# Patient Record
Sex: Female | Born: 1955 | Race: White | Hispanic: No | Smoking: Never smoker
Health system: Southern US, Community
[De-identification: ages and names within clinical notes are randomized; demographics above are authoritative.]

## PROBLEM LIST (undated history)

## (undated) DIAGNOSIS — R001 Bradycardia, unspecified: Secondary | ICD-10-CM

## (undated) DIAGNOSIS — B019 Varicella without complication: Secondary | ICD-10-CM

## (undated) DIAGNOSIS — T7840XA Allergy, unspecified, initial encounter: Secondary | ICD-10-CM

## (undated) HISTORY — DX: Varicella without complication: B01.9

## (undated) HISTORY — DX: Allergy, unspecified, initial encounter: T78.40XA

## (undated) HISTORY — DX: Bradycardia, unspecified: R00.1

---

## 1999-06-11 HISTORY — PX: ABDOMINAL HYSTERECTOMY: SHX81

## 2005-02-27 ENCOUNTER — Ambulatory Visit: Payer: Self-pay | Admitting: Unknown Physician Specialty

## 2006-03-04 ENCOUNTER — Ambulatory Visit: Payer: Self-pay | Admitting: Unknown Physician Specialty

## 2006-06-10 HISTORY — PX: LESION EXCISION: SHX5167

## 2007-04-23 ENCOUNTER — Ambulatory Visit: Payer: Self-pay | Admitting: Unknown Physician Specialty

## 2007-04-28 ENCOUNTER — Ambulatory Visit: Payer: Self-pay | Admitting: Unknown Physician Specialty

## 2008-04-26 ENCOUNTER — Ambulatory Visit: Payer: Self-pay | Admitting: Unknown Physician Specialty

## 2009-06-20 ENCOUNTER — Ambulatory Visit: Payer: Self-pay | Admitting: Unknown Physician Specialty

## 2010-07-04 ENCOUNTER — Ambulatory Visit: Payer: Self-pay | Admitting: Unknown Physician Specialty

## 2011-07-10 ENCOUNTER — Ambulatory Visit: Payer: Self-pay | Admitting: Unknown Physician Specialty

## 2012-07-27 ENCOUNTER — Ambulatory Visit: Payer: Self-pay | Admitting: Unknown Physician Specialty

## 2013-10-25 ENCOUNTER — Ambulatory Visit (INDEPENDENT_AMBULATORY_CARE_PROVIDER_SITE_OTHER): Payer: BC Managed Care – PPO | Admitting: Internal Medicine

## 2013-10-25 ENCOUNTER — Encounter: Payer: Self-pay | Admitting: Internal Medicine

## 2013-10-25 VITALS — BP 138/76 | HR 52 | Temp 98.4°F | Resp 16 | Ht 62.0 in | Wt 144.5 lb

## 2013-10-25 DIAGNOSIS — R5381 Other malaise: Secondary | ICD-10-CM

## 2013-10-25 DIAGNOSIS — Z23 Encounter for immunization: Secondary | ICD-10-CM

## 2013-10-25 DIAGNOSIS — Z113 Encounter for screening for infections with a predominantly sexual mode of transmission: Secondary | ICD-10-CM

## 2013-10-25 DIAGNOSIS — Z1211 Encounter for screening for malignant neoplasm of colon: Secondary | ICD-10-CM | POA: Insufficient documentation

## 2013-10-25 DIAGNOSIS — F411 Generalized anxiety disorder: Secondary | ICD-10-CM | POA: Insufficient documentation

## 2013-10-25 DIAGNOSIS — E663 Overweight: Secondary | ICD-10-CM | POA: Insufficient documentation

## 2013-10-25 DIAGNOSIS — E785 Hyperlipidemia, unspecified: Secondary | ICD-10-CM

## 2013-10-25 DIAGNOSIS — R5383 Other fatigue: Secondary | ICD-10-CM

## 2013-10-25 DIAGNOSIS — E559 Vitamin D deficiency, unspecified: Secondary | ICD-10-CM

## 2013-10-25 LAB — COMPREHENSIVE METABOLIC PANEL
ALT: 17 U/L (ref 0–35)
AST: 25 U/L (ref 0–37)
Albumin: 4.4 g/dL (ref 3.5–5.2)
Alkaline Phosphatase: 66 U/L (ref 39–117)
BILIRUBIN TOTAL: 0.9 mg/dL (ref 0.2–1.2)
BUN: 12 mg/dL (ref 6–23)
CALCIUM: 9.5 mg/dL (ref 8.4–10.5)
CHLORIDE: 106 meq/L (ref 96–112)
CO2: 29 mEq/L (ref 19–32)
Creatinine, Ser: 0.8 mg/dL (ref 0.4–1.2)
GFR: 74.44 mL/min (ref >60.00)
Glucose, Bld: 100 mg/dL — ABNORMAL HIGH (ref 70–99)
Potassium: 3.9 mEq/L (ref 3.5–5.1)
Sodium: 141 mEq/L (ref 135–145)
Total Protein: 7 g/dL (ref 6.0–8.3)

## 2013-10-25 MED ORDER — ALPRAZOLAM 0.5 MG PO TABS: 0.5000 mg | ORAL_TABLET | Freq: Every evening | ORAL | Status: DC | PRN

## 2013-10-25 NOTE — Patient Instructions (Addendum)
Welcome to our Clinic!!  You received  TDaP vaccine today (good for 10 years)  Please use the take home stool collection kit  for your colon cancer screening.    A positive result will result in referral for a diagnostic colonoscopy   We will schedule your mammogram at [facility] ASAP.  You received the TDaP vaccine today.  If you develop a sore arm , use tylenol and ibuprofen for a few days    We will contact you with the bloodwork results  This is  One version of a  "Low GI"  Diet:  It will still lower your blood sugars and allow you to lose 4 to 8  lbs  per month if you follow it carefully.  Your goal with exercise is a minimum of 30 minutes of aerobic exercise 5 days per week (Walking does not count once it becomes easy!)    All of the foods can be found at grocery stores and in bulk at [employer].  The Atkins protein bars and shakes are available in more varieties at [employer], [employer] and [place].     7 AM Breakfast:  Choose from the following:  Low carbohydrate Protein  Shakes (I recommend the EAS AdvantEdge "Carb Control" shakes  Or the low carb shakes by Atkins.    2.5 carbs   Arnold's "Sandwhich Thin"toasted  w/ peanut butter (no jelly: about 20 net carbs  "Bagel Thin" with cream cheese and salmon: about 20 carbs   a scrambled egg/bacon/cheese burrito made with Mission's "carb balance" whole wheat tortilla  (about 10 net carbs )   Avoid cereal and bananas, oatmeal and cream of wheat and grits. They are loaded with carbohydrates!   10 AM: high protein snack  Protein bar by Atkins (the snack size, under 200 cal, usually < 6 net carbs).    A stick of cheese:  Around 1 carb,  100 cal     Dannon Light n Fit [place] Yogurt  (80 cal, 8 carbs)  Other so called "protein bars" and Greek yogurts tend to be loaded with carbohydrates.  Remember, in food advertising, the word "energy" is synonymous for " carbohydrate."  Lunch:   A Sandwich using the bread choices listed, Can use any  Eggs,   lunchmeat, grilled meat or canned tuna), avocado, regular mayo/mustard  and cheese.  A Salad using blue cheese, ranch,  Goddess or vinagrette,  No croutons or "confetti" and no "candied nuts" but regular nuts OK.   No pretzels or chips.  Pickles and miniature sweet peppers are a good low carb alternative that provide a "crunch"  The bread is the only source of carbohydrate in a sandwich and  can be decreased by trying some of these alternatives to traditional loaf bread  Joseph's makes a pita bread and a flat bread that are 50 cal and 4 net carbs available at [place] and [place].  This can be toasted to use with hummous as well  Toufayan makes a low carb flatbread that's 100 cal and 9 net carbs available at [employer] and [employer]J's makes 2 sizes of  Low carb whole wheat tortilla  (The large one is 210 cal and 6 net carbs) Avoid "Low fat dressings, as well as Barry Brunner and [place] dressings They are loaded with sugar!   3 PM/ Mid day  Snack:  Consider  1 ounce of  almonds, walnuts, pistachios, pecans, peanuts,  Macadamia nuts or a nut medley.  Avoid "  granola"; the dried cranberries and raisins are loaded with carbohydrates. Mixed nuts as long as there are no raisins,  cranberries or dried fruit.    Try the prosciutto/mozzarella cheese sticks by Fiorruci  In deli /backery section   High protein      6 PM  Dinner:     Meat/fowl/fish with a green salad, and either broccoli, cauliflower, green beans, spinach, brussel sprouts or  Lima beans. DO NOT BREAD THE PROTEIN!!      There is a low carb pasta by Dreamfield's that is acceptable and tastes great: only 5 digestible carbs/serving.( All grocery stores but BJs carry it )  Try Hurley Cisco Angelo's chicken piccata or chicken or eggplant parm over low carb pasta.(Lowes and BJs)   Marjory Lies Sanchez's "Carnitas" (pulled pork, no sauce,  0 carbs) or his beef pot roast to make a dinner burrito (at BJ's)  Pesto over low carb pasta (bj's sells a good quality  pesto in the center refrigerated section of the deli   Try satueeing  Cheral Marker with mushroooms  Whole wheat pasta is still full of digestible carbs and  Not as low in glycemic index as Dreamfield's.   Brown rice is still rice,  So skip the rice and noodles if you eat [place] or [place] (or at least limit to 1/2 cup)  9 PM snack :   Breyer's "low carb" fudgsicle or  ice cream bar (Carb Smart line), or  Weight Watcher's ice cream bar , or another "no sugar added" ice cream;  a serving of fresh berries/cherries with whipped cream   Cheese or DANNON'S LlGHT N FIT GREEK YOGURT  8 ounces of [place] unsweetened almond/cococunut milk    Avoid bananas, pineapple, grapes  and watermelon on a regular basis because they are high in sugar.  THINK OF THEM AS DESSERT  Remember that snack Substitutions should be less than 10 NET carbs per serving and meals < 20 carbs. Remember to subtract fiber grams to get the "net carbs."

## 2013-10-25 NOTE — Assessment & Plan Note (Signed)
Patient is physically fit .  Diet addressed.  Fasting lipids ordered.

## 2013-10-25 NOTE — Assessment & Plan Note (Signed)
With occasional crying spells and insomnia. The risks and benefits of benzodiazepine use were discussed with patient today including excessive sedation leading to respiratory depression,  impaired thinking/driving, and addiction.  Patient was advised to avoid concurrent use with alcohol, to use medication only as needed and not to share with others  .

## 2013-10-25 NOTE — Assessment & Plan Note (Signed)
Available methods discussed.  Due to high deductible and no FH of [place]  Home FOBTs accepted.

## 2013-10-25 NOTE — Progress Notes (Signed)
Pre-visit discussion using our clinic review tool. No additional management support is needed unless otherwise documented below in the visit note.  

## 2013-10-25 NOTE — Progress Notes (Signed)
Patient ID: Michelle Mahoney, female   DOB: April 11, 1956, 58 y.o.   MRN: [id number]     Patient is a healthy adult female with no significant PMH, presents today with no specific complaints.    She sleeps an average of 7 to 9 hours per night,  Wakes up feeling refreshed.  Eats a healthy balanced diet.  Drinks 1 or  2 glasses of wine per week. She exercises  5 days per week with a combination of aerobic, stretching and light weights.  She is independent of all ADLs.  Does not use a walker or other assistive device.  No trouble climbing stairs. No chronic diarrhea or constipation.  Patient Active Problem List   Diagnosis Date Noted  . Overweight (BMI 25.0-29.9) 10/25/2013  . Anxiety state, unspecified 10/25/2013  . Screening for colon cancer 10/25/2013    Subjective:  CC:   Chief Complaint  Patient presents with  . Establish Care    HPI:   Michelle Mahoney a 58 y.o. female who presents with no significant PMH, presents today with no specific complaints.    She sleeps an average of 7 to 9 hours per night,  Wakes up feeling refreshed.  Eats a healthy balanced diet.  Drinks 1 or  2 glasses of wine per week. She exercises  5 days per week with a combination of aerobic, stretching and light weights.  She is independent of all ADLs.  Does not use a walker or other assistive device.  No trouble climbing stairs. No chronic diarrhea or constipation. Last PAP smear 2 yrs ago, no abnormals.    S/p hysterectomy due to spotting, [place] not sure if cervix was taken  2001.  Still has ovaries . Some hot flashes   No other menopause.    History of hives managed with daily zyrtec since her ENT eval in 2014  overdue for colon Ca screening discussed ,  Has a $5K dedicutble, no FH of [place].  FOBT cards discussed.   Anxiety with occasional tearfulness and insomnia.  Has two grown daughters,  Both expecting their first child. 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 requesting a prn medication.   Overweight:  Works out  At least 5 days/week,  Uses a Systems analystpersonal trainer,  Cannot get off plateau currently. Does cardio and weights      Past Medical History  Diagnosis Date  . Chicken pox     No Known Allergies   Past Surgical History  Procedure Laterality Date  . Abdominal hysterectomy  2001    History   Social History  . Marital Status: Married    Spouse Name: Cherlynn KaiserRoger Straub    Number of Children: N/A  . Years of Education: N/A   Occupational History  .     Social History Main Topics  . Smoking status: Never Smoker   . Smokeless tobacco: Never Used  . Alcohol Use: Yes     Comment: rarely  . Drug Use: No  . Sexual Activity: Not Currently   Other Topics Concern  . 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      Objective:  BP 138/76  Pulse 52  Temp(Src)  98.4 F (36.9 C) (Oral)  Resp 16  Ht 5\' 2"  (1.575 m)  Wt 144 lb 8 oz (65.545 kg)  BMI 26.42 kg/m2  SpO2 99%  General appearance: alert, cooperative and appears stated age Ears: normal TM's and external ear canals both ears Throat: lips, mucosa, and tongue normal; teeth and gums normal Neck: no adenopathy, no carotid bruit, supple, symmetrical, trachea midline and thyroid not enlarged, symmetric, no tenderness/mass/nodules Back: symmetric, no curvature. ROM normal. No CVA tenderness. Lungs: clear to auscultation bilaterally Heart: regular rate and rhythm, S1, S2 normal, no murmur, click, rub or gallop Abdomen: soft, non-tender; bowel sounds normal; no masses,  no organomegaly Pulses: 2+ and symmetric Skin: Skin color, texture, turgor normal. No rashes or lesions Lymph nodes: Cervical, supraclavicular, and axillary nodes normal.  Assessment and  Plan:  Overweight (BMI 25.0-29.9) Patient is physically fit .  Diet addressed.  Fasting lipids ordered.   Anxiety state, unspecified With occasional crying spells and insomnia. The risks and benefits of benzodiazepine use were discussed with patient today including excessive sedation leading to respiratory depression,  impaired thinking/driving, and addiction.  Patient was advised to avoid concurrent use with alcohol, to use medication only as needed and not to share with others  .   Screening for colon cancer Available methods discussed.  Due to high deductible and no FH of [place]  Home FOBTs accepted.    Updated Medication List Outpatient Encounter Prescriptions as of 10/25/2013  Medication Sig  . ALPRAZolam (XANAX) 0.5 MG tablet Take 1 tablet (0.5 mg total) by mouth at bedtime as needed for anxiety.  . cetirizine (ZYRTEC) 10 MG tablet Take 10 mg by mouth daily.  . Multiple Vitamins-Minerals (MULTIVITAMIN WITH MINERALS) tablet Take 1 tablet by mouth daily.     Orders Placed This Encounter  Procedures  . Fecal occult blood, imunochemical  . Tdap vaccine greater than or equal to 7yo IM  . CBC with Differential  . Comprehensive metabolic panel  . TSH  . Lipid panel  . Vit D  25 hydroxy (rtn osteoporosis monitoring)  . Hepatitis C antibody  . HIV antibody    No Follow-up on file.

## 2013-10-26 LAB — VITAMIN D 25 HYDROXY (VIT D DEFICIENCY, FRACTURES): Vit D, 25-Hydroxy: 59 ng/mL (ref 30–89)

## 2013-10-26 LAB — HIV ANTIBODY (ROUTINE TESTING W REFLEX): HIV 1&2 Ab, 4th Generation: NONREACTIVE

## 2013-10-26 LAB — HEPATITIS C ANTIBODY: HCV AB: NEGATIVE

## 2013-10-27 ENCOUNTER — Encounter: Payer: Self-pay | Admitting: *Deleted

## 2013-11-02 ENCOUNTER — Ambulatory Visit (INDEPENDENT_AMBULATORY_CARE_PROVIDER_SITE_OTHER): Payer: BC Managed Care – PPO | Admitting: *Deleted

## 2013-11-02 DIAGNOSIS — E663 Overweight: Secondary | ICD-10-CM

## 2013-11-02 DIAGNOSIS — Z1211 Encounter for screening for malignant neoplasm of colon: Secondary | ICD-10-CM

## 2013-11-02 DIAGNOSIS — E785 Hyperlipidemia, unspecified: Secondary | ICD-10-CM

## 2013-11-02 DIAGNOSIS — F411 Generalized anxiety disorder: Secondary | ICD-10-CM

## 2013-11-02 LAB — FECAL OCCULT BLOOD, IMMUNOCHEMICAL: Fecal Occult Bld: POSITIVE — AB

## 2013-11-08 LAB — FECAL OCCULT BLOOD, GUAIAC: Fecal Occult Blood: POSITIVE

## 2013-11-09 ENCOUNTER — Encounter: Payer: Self-pay | Admitting: General Surgery

## 2013-11-14 NOTE — Assessment & Plan Note (Signed)
Her BMI remains unchanged despite exercise. We discussed the nature and quality of her exercises well as of her diet. Usually what I find is that people are not exercising as vigorously as they should to achieve a sustained heart rate in the aerobic zone. I suggested that she consider joining a gym and using a personal trainer to help guide her efforts.   I also am advising her to get back on the low GI diet using six smaller meals a day to stimulate her metabolism.   

## 2013-11-14 NOTE — Assessment & Plan Note (Signed)
She has infrequent episodes of extreme emotions.  Trial of  alprazolam The risks and benefits of benzodiazepine use were discussed with patient today including excessive sedation leading to respiratory depression,  impaired thinking/driving, and addiction.  Patient was advised to avoid concurrent use with alcohol, to use medication only as needed and not to share with others  .

## 2013-11-14 NOTE — Progress Notes (Signed)
Patient ID: Michelle MuslimLora Mahoney, female   DOB: 1955-11-28, 58 y.o.   MRN: [id number]   Patient Active Problem List   Diagnosis Date Noted  . Overweight (BMI 25.0-29.9) 10/25/2013  . Anxiety state, unspecified 10/25/2013  . Screening for colon cancer 10/25/2013    Subjective:  CC:   No chief complaint on file.   HPI:   Michelle Mahoney a 58 y.o. female who presents as a new patient with cc of  anxiety and inability to lose weight .    She sleeps an average of 7 to 9 hours per night,  Wakes up feeling refreshed.  Eats a healthy balanced diet.  Drinks 1 or  2 glasses of wine per week. She exercises  5 days per week with a combination of aerobic, stretching and light weights.    No chronic diarrhea or constipation.  Last PAP smear 2 yrs ago, no abnormals.    S/p hysterectomy due to spotting, Michelle Mahoney not sure if cervix was taken  2001.  Still has ovaries . Some hot flashes   No other menopause symptoms.    History of hives managed with daily zyrtec since her ENT eval in 2014  overdue for colon Ca screening discussed ,  Has a $5K dedicutble, no FH of [place].  FOBT cards discussed.   Anxiety with occasional tearfulness and insomnia.  Has two grown daughters,  Both expecting their first child. Also cites increased stressors due to 386 yr old mother  [facility]  [facility] apparently caused by diskitis. requesting a prn medication.   Overweight:  Works out  At least 5 days/week,  Uses a Systems analystpersonal trainer,  Cannot get off plateau currently. Does cardio and weights .  Diet reviewed.    Past Medical History  Diagnosis Date  . Chicken pox    No Known Allergies   Past Surgical History  Procedure Laterality Date  . Abdominal hysterectomy  2001    History   Social History  . Marital Status: Married    Spouse Name: Michelle Mahoney    Number of Children: N/A  . Years of Education: N/A   Occupational History  .     Social History Main Topics  . Smoking status: Never Smoker   . Smokeless  tobacco: Never Used  . Alcohol Use: Yes     Comment: rarely  . Drug Use: No  . Sexual Activity: Not Currently   Other Topics Concern  . 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     Review of Systems:   The rest of the review of systems was negative except those addressed in the HPI.      Objective:  There were no vitals taken for this visit.  General appearance: alert, cooperative and appears stated age Ears: normal TM's and external ear canals both ears Throat: lips, mucosa, and tongue normal; teeth and gums normal Neck: no adenopathy, no carotid bruit, supple, symmetrical, trachea midline and thyroid not enlarged, symmetric, no tenderness/mass/nodules Back: symmetric, no curvature. ROM normal. No CVA tenderness. Lungs: clear to auscultation bilaterally Heart: regular rate and rhythm, S1, S2 normal, no murmur, click, rub or gallop Abdomen: soft, non-tender; bowel sounds normal; no masses,  no organomegaly Pulses: 2+ and symmetric Skin: Skin color, texture, turgor normal. No rashes or lesions Lymph nodes: Cervical, supraclavicular, and axillary nodes normal.  Assessment  and Plan:  Anxiety state, unspecified She has infrequent episodes of extreme emotions.  Trial of  alprazolam The risks and benefits of benzodiazepine use were discussed with patient today including excessive sedation leading to respiratory depression,  impaired thinking/driving, and addiction.  Patient was advised to avoid concurrent use with alcohol, to use medication only as needed and not to share with others  .    Overweight (BMI 25.0-29.9) Her BMI remains unchanged despite exercise. We discussed the nature and quality of her exercises well as of her diet. Usually what I find is that people are not exercising as vigorously as they should to achieve a sustained heart rate in the aerobic  zone. I suggested that she consider joining a gym and using a personal trainer to help guide her efforts.   I also am advising her to get back on the low GI diet using six smaller meals a day to stimulate her metabolism.     Updated Medication List Outpatient Encounter Prescriptions as of 11/02/2013  Medication Sig  . ALPRAZolam (XANAX) 0.5 MG tablet Take 1 tablet (0.5 mg total) by mouth at bedtime as needed for anxiety.  . cetirizine (ZYRTEC) 10 MG tablet Take 10 mg by mouth daily.  . Multiple Vitamins-Minerals (MULTIVITAMIN WITH MINERALS) tablet Take 1 tablet by mouth daily.     Orders Placed This Encounter  Procedures  . Ambulatory referral to General Surgery    No Follow-up on file.

## 2013-11-22 ENCOUNTER — Telehealth: Payer: Self-pay | Admitting: *Deleted

## 2013-11-22 ENCOUNTER — Other Ambulatory Visit: Payer: Self-pay | Admitting: Internal Medicine

## 2013-11-22 DIAGNOSIS — Z1239 Encounter for other screening for malignant neoplasm of breast: Secondary | ICD-10-CM

## 2013-11-22 NOTE — Telephone Encounter (Signed)
Patient called checking on Mammogram referral, please advise.

## 2013-11-22 NOTE — Progress Notes (Unsigned)
Patient notified referral in process. 

## 2013-11-23 ENCOUNTER — Other Ambulatory Visit: Payer: Self-pay | Admitting: General Surgery

## 2013-11-23 ENCOUNTER — Encounter: Payer: Self-pay | Admitting: General Surgery

## 2013-11-23 ENCOUNTER — Ambulatory Visit (INDEPENDENT_AMBULATORY_CARE_PROVIDER_SITE_OTHER): Payer: BC Managed Care – PPO | Admitting: General Surgery

## 2013-11-23 VITALS — BP 124/76 | HR 76 | Resp 14 | Ht 62.0 in | Wt 146.0 lb

## 2013-11-23 DIAGNOSIS — Z1211 Encounter for screening for malignant neoplasm of colon: Secondary | ICD-10-CM

## 2013-11-23 MED ORDER — POLYETHYLENE GLYCOL 3350 17 GM/SCOOP PO POWD: 1.0000 | Freq: Once | ORAL | Status: DC

## 2013-11-23 NOTE — Progress Notes (Signed)
Patient ID: Michelle Mahoney, female   DOB: March 13, 1956, 58 y.o.   MRN: [id number]  Chief Complaint  Patient presents with  . Colonoscopy    HPI Michelle Mahoney is a 58 y.o. female.  Here today to discuss having a colonoscopy. No gastrointestinal issues. Denies any bloody stools. Bowels move daily. States she has never had a colonoscopy. Denies family history of colon cancer or polyps.  HPI  Past Medical History  Diagnosis Date  . Chicken pox     Past Surgical History  Procedure Laterality Date  . Abdominal hysterectomy  2001  . Lesion excision  2008    anal area    Family History  Problem Relation Age of Onset  . Cancer Mother 40    melanoma  . Stroke Mother [id number]    secondary to septic emboli spine infection  . Heart disease Father 9782    Heart failure  . Mental illness Father 5880    dementia    Social History History  Substance Use Topics  . Smoking status: Never Smoker   . Smokeless tobacco: Never Used  . Alcohol Use: Yes     Comment: rarely    No Known Allergies  Current Outpatient Prescriptions  Medication Sig Dispense Refill  . ALPRAZolam (XANAX) 0.5 MG tablet Take 1 tablet (0.5 mg total) by mouth at bedtime as needed for anxiety.  30 tablet  3  . cetirizine (ZYRTEC) 10 MG tablet Take 10 mg by mouth as needed.       . Multiple Vitamins-Minerals (MULTIVITAMIN WITH MINERALS) tablet Take 1 tablet by mouth daily.      . polyethylene glycol powder (GLYCOLAX/MIRALAX) powder Take 255 g (1 Container total) by mouth once.  255 g  0   No current facility-administered medications for this visit.    Review of Systems Review of Systems  Constitutional: Negative.   Respiratory: Negative.   Cardiovascular: Negative.   Gastrointestinal: Negative for nausea, vomiting, diarrhea, constipation and blood in stool.    Blood pressure 124/76, pulse 76, resp. rate 14, height 5\' 2"  (1.575 m), weight 146 lb (66.225 kg).  Physical Exam Physical Exam  Constitutional: She is oriented  to person, place, and time. She appears well-developed and well-nourished.  Neck: Neck supple.  Cardiovascular: Normal rate, regular rhythm and normal heart sounds.   Pulmonary/Chest: Effort normal and breath sounds normal.  Lymphadenopathy:    She has no cervical adenopathy.  Neurological: She is alert and oriented to person, place, and time.  Skin: Skin is warm and dry.    Data Reviewed EMR  Assessment    Candidate for screening colonoscopy.     Plan          Colonoscopy with possible biopsy/polypectomy prn: Information regarding the procedure, including its potential risks and complications (including but not limited to perforation of the bowel, which may require emergency surgery to repair, and bleeding) was verbally given to the patient. Educational information regarding lower instestinal endoscopy was given to the patient. Written instructions for how to complete the bowel prep using Miralax were provided. The importance of drinking ample fluids to avoid dehydration as a result of the prep emphasized.  The patient is scheduled for a colon[facility] at [facility]. She is aware to pre register with the hospital at least 2 days prior. Miralax prescription has been sent into her pharmacy. Patient is aware of date and instructions.   PCP: Macky Lowerullo, Teresa L   Bana Borgmeyer W 11/23/2013, 9:14 PM

## 2013-11-23 NOTE — Patient Instructions (Addendum)
The patient is aware to call back for any questions or concerns.  Colonoscopy A colonoscopy is an exam to look at the entire large intestine (colon). This exam can help find problems such as tumors, polyps, inflammation, and areas of bleeding. The exam takes about 1 hour.  [facility] KNOW ABOUT:   Any allergies you have.  All medicines you are taking, including vitamins, herbs, eye drops, creams, and over-the-counter medicines.  Previous problems you or members of your family have had with the use of anesthetics.  Any blood disorders you have.  Previous surgeries you have had.  Medical conditions you have. RISKS AND COMPLICATIONS  Generally, this is a safe procedure. However, as with any procedure, complications can occur. Possible complications include:  Bleeding.  Tearing or rupture of the colon wall.  Reaction to medicines given during the exam.  Infection (rare). BEFORE THE PROCEDURE   Ask your health care provider about changing or stopping your regular medicines.  You may be prescribed an oral bowel prep. This involves drinking a large amount of medicated liquid, starting the day before your procedure. The liquid will cause you to have multiple loose stools until your stool is almost clear or light green. This cleans out your colon in preparation for the procedure.  Do not eat or drink anything else once you have started the bowel prep, unless your health care provider tells you it is safe to do so.  Arrange for someone to drive you home after the procedure. PROCEDURE   You will be given medicine to help you relax (sedative).  You will lie on your side with your knees bent.  A long, flexible tube with a light and camera on the end (colonoscope) will be inserted through the rectum and into the colon. The camera sends video back to a computer screen as it moves through the colon. The colonoscope also releases carbon dioxide gas to inflate the colon.  This helps your health care provider see the area better.  During the exam, your health care provider may take a small tissue sample (biopsy) to be examined under a microscope if any abnormalities are found.  The exam is finished when the entire colon has been viewed. AFTER THE PROCEDURE   Do not drive for 24 hours after the exam.  You may have a small amount of blood in your stool.  You may pass moderate amounts of gas and have mild abdominal cramping or bloating. This is caused by the gas used to inflate your colon during the exam.  Ask when your test results will be ready and how you will get your results. Make sure you get your test results. Document Released: 05/24/2000 Document Revised: 03/17/2013 Document Reviewed: 02/01/2013 [facility] Patient Information 2014 [employer] [place].  The patient is [facility]. She is aware to pre register with the hospital at least 2 days prior. Miralax prescription has been sent into her pharmacy. Patient is aware of date and instructions.

## 2013-12-08 ENCOUNTER — Other Ambulatory Visit (HOSPITAL_COMMUNITY)
Admission: RE | Admit: 2013-12-08 | Discharge: 2013-12-08 | Disposition: A | Discharge status: Home / Self Care | Payer: BC Managed Care – PPO | Source: Ambulatory Visit | Attending: Internal Medicine | Admitting: Internal Medicine

## 2013-12-08 ENCOUNTER — Ambulatory Visit (INDEPENDENT_AMBULATORY_CARE_PROVIDER_SITE_OTHER): Payer: BC Managed Care – PPO | Admitting: Internal Medicine

## 2013-12-08 ENCOUNTER — Telehealth: Payer: Self-pay | Admitting: Internal Medicine

## 2013-12-08 ENCOUNTER — Encounter: Payer: Self-pay | Admitting: Internal Medicine

## 2013-12-08 VITALS — BP 106/60 | HR 52 | Temp 97.9°F | Resp 12 | Wt 145.1 lb

## 2013-12-08 DIAGNOSIS — E663 Overweight: Secondary | ICD-10-CM

## 2013-12-08 DIAGNOSIS — Z124 Encounter for screening for malignant neoplasm of cervix: Secondary | ICD-10-CM

## 2013-12-08 DIAGNOSIS — Z1151 Encounter for screening for human papillomavirus (HPV): Secondary | ICD-10-CM | POA: Insufficient documentation

## 2013-12-08 DIAGNOSIS — Z01419 Encounter for gynecological examination (general) (routine) without abnormal findings: Secondary | ICD-10-CM | POA: Insufficient documentation

## 2013-12-08 DIAGNOSIS — Z0000 Encounter for general adult medical examination without abnormal findings: Secondary | ICD-10-CM

## 2013-12-08 LAB — HM PAP SMEAR

## 2013-12-08 NOTE — Patient Instructions (Addendum)
.You had your annual  wellness exam today.  We will repeat your PAP smear in 2017 or later    We will schedule your mammogram at  [facility] ASAP .   You received the TDaP vaccine  In May 2015.   This is  One version of a  "Low GI"  Diet:  It will still lower your blood sugars and allow you to lose 4 to 8  lbs  per month if you follow it carefully.  Your goal with exercise is a minimum of 30 minutes of aerobic exercise 5 days per week (Walking does not count once it becomes easy!)    All of the foods can be found at grocery stores and in bulk at [employer].  The Atkins protein bars and shakes are available in more varieties at Target, WalMart and [employer]'s Foods.     7 AM Breakfast:  Choose from the following:  Low carbohydrate Protein  Shakes (I recommend the EAS AdvantEdge "Carb Control" shakes  Or the low carb shakes by Atkins.    2.5 carbs   Arnold's "Sandwhich Thin"toasted  w/ peanut butter (no jelly: about 20 net carbs  "Bagel Thin" with cream cheese and salmon: about 20 carbs   a scrambled egg/bacon/cheese burrito made with Mission's "carb balance" whole wheat tortilla  (about 10 net carbs )   Avoid cereal and bananas, oatmeal and cream of wheat and grits. They are loaded with carbohydrates!   10 AM: high protein snack  Protein bar by Atkins (the snack size, under 200 cal, usually < 6 net carbs).    A stick of cheese:  Around 1 carb,  100 cal     Dannon Light n Fit [place]Greek Yogurt  (80 cal, 8 carbs)  Other so called "protein bars" and Greek yogurts tend to be loaded with carbohydrates.  Remember, in food advertising, the word "energy" is synonymous for " carbohydrate."  Lunch:   A Sandwich using the bread choices listed, Can use any  Eggs,  lunchmeat, grilled meat or canned tuna), avocado, regular mayo/mustard  and cheese.  A Salad using blue cheese, ranch,  Goddess or vinagrette,  No croutons or "confetti" and no "candied nuts" but regular nuts OK.   No pretzels or chips.  Pickles and  miniature sweet peppers are a good low carb alternative that provide a "crunch"  The bread is the only source of carbohydrate in a sandwich and  can be decreased by trying some of these alternatives to traditional loaf bread  Joseph's makes a pita bread and a flat bread that are 50 cal and 4 net carbs available at BJs and WalMart.  This can be toasted to use with hummous as well  Toufayan makes a low carb flatbread that's 100 cal and 9 net carbs available at [employer] and [employer]  Mission makes 2 sizes of  Low carb whole wheat tortilla  (The large one is 210 cal and 6 net carbs) Avoid "Low fat dressings, as well as Reyne DumasCatalina and [address] dressings They are loaded with sugar!   3 PM/ Mid day  Snack:  Consider  1 ounce of  almonds, walnuts, pistachios, pecans, peanuts,  Macadamia nuts or a nut medley.  Avoid "granola"; the dried cranberries and raisins are loaded with carbohydrates. Mixed nuts as long as there are no raisins,  cranberries or dried fruit.    Try the prosciutto/mozzarella cheese sticks by Fiorruci  In deli /backery section   High protein  6 PM  Dinner:     Meat/fowl/fish with a green salad, and either broccoli, cauliflower, green beans, spinach, brussel sprouts or  Lima beans. DO NOT BREAD THE PROTEIN!!      There is a low carb pasta by Dreamfield's that is acceptable and tastes great: only 5 digestible carbs/serving.( All grocery stores but BJs carry it )  Try Kai LevinsMichel Angelo's chicken piccata or chicken or eggplant parm over low carb pasta.(Lowes and BJs)   Clifton CustardAaron Sanchez's "Carnitas" (pulled pork, no sauce,  0 carbs) or his beef pot roast to make a dinner burrito (at BJ's)  Pesto over low carb pasta (bj's sells a good quality pesto in the center refrigerated section of the deli   Try satueeing  Roosvelt HarpsBok Choy with mushroooms  Whole wheat pasta is still full of digestible carbs and  Not as low in glycemic index as Dreamfield's.   Brown rice is still rice,  So skip the rice and  noodles if you eat Congohinese or [place] Zealandhai (or at least limit to 1/2 cup)  9 PM snack :   Breyer's "low carb" fudgsicle or  ice cream bar (Carb Smart line), or  Weight Watcher's ice cream bar , or another "no sugar added" ice cream;  a serving of fresh berries/cherries with whipped cream   Use DANNON'S LlGHT N FIT [place] VANILLA  YOGURT to make a parfait with blueberries, walnuts and Reddi Whip   8 ounces of Blue Diamond unsweetened almond/cococunut milk    Avoid bananas, pineapple, grapes  and watermelon on a regular basis because they are high in sugar.  THINK OF THEM AS DESSERT  Remember that snack Substitutions should be less than 10 NET carbs per serving and meals < 20 carbs. Remember to subtract fiber grams to get the "net carbs."

## 2013-12-08 NOTE — Telephone Encounter (Signed)
Michelle Mahoney  PATIENT'S LIPID PANEL WAS COLLECTED ON MAY 26TH BUT NEVER RESULTED,  CAN YOU FIND OUT WHY?

## 2013-12-08 NOTE — Progress Notes (Signed)
Pre visit review using our clinic review tool, if applicable. No additional management support is needed unless otherwise documented below in the visit note. 

## 2013-12-11 DIAGNOSIS — Z0000 Encounter for general adult medical examination without abnormal findings: Secondary | ICD-10-CM | POA: Insufficient documentation

## 2013-12-11 NOTE — Assessment & Plan Note (Signed)
Annual comprehensive exam was done including breast, pelvic and PAP smear. All screenings have been addressed .  

## 2013-12-11 NOTE — Assessment & Plan Note (Signed)
I have addressed  BMI and recommended wt loss of 10% of body weigh over the next 6 months using a low glycemic index diet and regular exercise a minimum of 5 days per week.   

## 2013-12-11 NOTE — Progress Notes (Signed)
Patient ID: Michelle Mahoney Ashby, female   DOB: [birth year], 57 y.o.   MRN: [id number]  Subjective:    Michelle Mahoney Pledger is a 58 y.o. female who presents for an annual exam. The patient has no complaints today. The patient is not currently sexually active. GYN screening history: last pap: was normal. The patient wears seatbelts: yes. The patient participates in regular exercise: yes. Has the patient ever been transfused or tattooed?: yes. The patient reports that there is not domestic violence in her life.   Menstrual History: OB History   Grav Para Term Preterm Abortions TAB SAB Ect Mult Living   3 2   1  1  0       Obstetric Comments   1st Menstrual Cycle:  14  1st Pregnancy:  [age] Menopause age 89      Menarche age: [age]  No LMP recorded. Patient has had a hysterectomy.    The following portions of the patient's history were reviewed and updated as appropriate: allergies, current medications, past family history, past medical history, past social history, past surgical history and problem list.  Review of Systems A comprehensive review of systems was negative.    Objective:   BP 106/60  Pulse 52  Temp(Src) 97.9 F (36.6 C) (Oral)  Resp 12  Wt 145 lb 1.9 oz (65.826 kg)  SpO2 99%  General Appearance:    Alert, cooperative, no distress, appears stated age  Head:    Normocephalic, without obvious abnormality, atraumatic  Eyes:    PERRL, conjunctiva/corneas clear, EOM's intact, fundi    benign, both eyes  Ears:    Normal TM's and external ear canals, both ears  Nose:   Nares normal, septum midline, mucosa normal, no drainage    or sinus tenderness  Throat:   Lips, mucosa, and tongue normal; teeth and gums normal  Neck:   Supple, symmetrical, trachea midline, no adenopathy;    thyroid:  no enlargement/tenderness/nodules; no carotid   bruit or JVD  Back:     Symmetric, no curvature, ROM normal, no CVA tenderness  Lungs:     Clear to auscultation bilaterally, respirations unlabored  Chest  Wall:    No tenderness or deformity   Heart:    Regular rate and rhythm, S1 and S2 normal, no murmur, rub   or gallop  Breast Exam:    No tenderness, masses, or nipple abnormality  Abdomen:     Soft, non-tender, bowel sounds active all four quadrants,    no masses, no organomegaly  Genitalia:    Pelvic: cervix normal in appearance, external genitalia normal, no adnexal masses or tenderness, no cervical motion tenderness, rectovaginal septum normal, uterus normal size, shape, and consistency and vagina normal without discharge  Extremities:   Extremities normal, atraumatic, no cyanosis or edema  Pulses:   2+ and symmetric all extremities  Skin:   Skin color, texture, turgor normal, no rashes or lesions  Lymph nodes:   Cervical, supraclavicular, and axillary nodes normal  Neurologic:   CNII-XII intact, normal strength, sensation and reflexes    throughout     Assessment  And Plan:   Overweight (BMI 25.0-29.9) I have addressed  BMI and recommended wt loss of 10% of body weigh over the next 6 months using a low glycemic index diet and regular exercise a minimum of 5 days per week.    Routine general medical examination at a health care facility Annual comprehensive exam was done including breast, pelvic and PAP smear. All screenings  have been addressed .    Updated Medication List Outpatient Encounter Prescriptions as of 12/08/2013  Medication Sig  . ALPRAZolam (XANAX) 0.5 MG tablet Take 1 tablet (0.5 mg total) by mouth at bedtime as needed for anxiety.  . cetirizine (ZYRTEC) 10 MG tablet Take 10 mg by mouth as needed.   . Multiple Vitamins-Minerals (MULTIVITAMIN WITH MINERALS) tablet Take 1 tablet by mouth daily.  . polyethylene glycol powder (GLYCOLAX/MIRALAX) powder Take 255 g (1 Container total) by mouth once.

## 2013-12-13 LAB — CYTOLOGY - PAP

## 2013-12-13 NOTE — Telephone Encounter (Signed)
Waiting call back from Lab.

## 2013-12-14 ENCOUNTER — Encounter: Payer: Self-pay | Admitting: *Deleted

## 2013-12-20 ENCOUNTER — Ambulatory Visit
Admission: RE | Admit: 2013-12-20 | Discharge: 2013-12-20 | Disposition: A | Discharge status: Home / Self Care | Payer: BC Managed Care – PPO | Source: Ambulatory Visit | Attending: Internal Medicine | Admitting: Internal Medicine

## 2013-12-20 DIAGNOSIS — Z1239 Encounter for other screening for malignant neoplasm of breast: Secondary | ICD-10-CM

## 2013-12-22 ENCOUNTER — Telehealth: Payer: Self-pay | Admitting: *Deleted

## 2013-12-22 NOTE — Telephone Encounter (Signed)
Patient was contacted today to confirm that her medications have not changed since last office visit and she confirms.   This patient reports she does have Miralax prescription and she will go to the hospital tomorrow to pre-register. She was instructed to call the office should she have further questions. We will proceed with colonoscopy that is scheduled at [facility] for 12-29-13.

## 2013-12-29 ENCOUNTER — Ambulatory Visit: Payer: Self-pay | Admitting: General Surgery

## 2013-12-29 DIAGNOSIS — Z1211 Encounter for screening for malignant neoplasm of colon: Secondary | ICD-10-CM

## 2013-12-30 ENCOUNTER — Encounter: Payer: Self-pay | Admitting: General Surgery

## 2014-04-11 ENCOUNTER — Encounter: Payer: Self-pay | Admitting: Internal Medicine

## 2014-04-18 ENCOUNTER — Other Ambulatory Visit: Payer: Self-pay | Admitting: Internal Medicine

## 2014-04-18 NOTE — Telephone Encounter (Signed)
Ok to refill,  printed rx  

## 2014-04-18 NOTE — Telephone Encounter (Signed)
Last visit 12/08/13, ok refill?

## 2014-04-19 NOTE — Telephone Encounter (Signed)
Faxed to pharmacy

## 2014-10-12 ENCOUNTER — Other Ambulatory Visit: Payer: Self-pay | Admitting: Internal Medicine

## 2014-10-12 NOTE — Telephone Encounter (Signed)
Last OV 7.13.15, spoke with pt scheduled OV 5.9.16.  Advise refill

## 2014-10-13 NOTE — Telephone Encounter (Signed)
Ok to refill,  Authorized in [employer].  Can call in

## 2014-10-13 NOTE — Telephone Encounter (Signed)
Rx called in 

## 2014-10-17 ENCOUNTER — Encounter: Payer: Self-pay | Admitting: Internal Medicine

## 2014-10-17 ENCOUNTER — Ambulatory Visit (INDEPENDENT_AMBULATORY_CARE_PROVIDER_SITE_OTHER): Payer: BLUE CROSS/BLUE SHIELD | Admitting: Internal Medicine

## 2014-10-17 VITALS — BP 126/78 | HR 50 | Temp 98.0°F | Resp 16 | Ht 62.0 in | Wt 151.0 lb

## 2014-10-17 DIAGNOSIS — F411 Generalized anxiety disorder: Secondary | ICD-10-CM

## 2014-10-17 MED ORDER — ALPRAZOLAM 0.5 MG PO TABS: ORAL_TABLET | ORAL | Status: DC

## 2014-10-17 NOTE — Progress Notes (Signed)
Patient ID: Michelle Mahoney, female   DOB: 1956/04/06, 59 y.o.   MRN: [id number]  Patient Active Problem List   Diagnosis Date Noted  . Routine general medical examination at a health care facility 12/11/2013  . Special screening for malignant neoplasms, colon 11/23/2013  . Overweight (BMI 25.0-29.9) 10/25/2013  . Anxiety state 10/25/2013  . Screening for colon cancer 10/25/2013    Subjective:  CC:   Chief Complaint  Patient presents with  . Follow-up    medication refill.    HPI:   Michelle Mahoney is a 59 y.o. female who presents for 6 month follow up on anxiety state, managed with prn use of alprazolam.  Patient has been using medication on a prn basis ,  Triggers for anxiety include mother's recent decline in health and transition to nursing home. Mother is unhappy  In current situation and daughter is only child, primary contact .  Uses the alprazolam to deal with the stress of dealing with mother's continued requests to go home.  Mother has some signs of dementia vs sundowning/delirium.  Patient works out regularly to relieve stress,  Does not combine medicaiton with alcohol and has not escalated dose.    Past Medical History  Diagnosis Date  . Chicken pox     Past Surgical History  Procedure Laterality Date  . Abdominal hysterectomy  2001  . Lesion excision  2008    anal area       The following portions of the patient's history were reviewed and updated as appropriate: Allergies, current medications, and problem list.    Review of Systems:   Patient denies headache, fevers, malaise, unintentional weight loss, skin rash, eye pain, sinus congestion and sinus pain, sore throat, dysphagia,  hemoptysis , cough, dyspnea, wheezing, chest pain, palpitations, orthopnea, edema, abdominal pain, nausea, melena, diarrhea, constipation, flank pain, dysuria, hematuria, urinary  Frequency, nocturia, numbness, tingling, seizures,  Focal weakness, Loss of consciousness,  Tremor,  insomnia, depression, anxiety, and suicidal ideation.     History   Social History  . Marital Status: Married    Spouse Name: Cherlynn KaiserRoger Wimes  . Number of Children: N/A  . Years of Education: N/A   Occupational History  .     Social History Main Topics  . Smoking status: Never Smoker   . Smokeless tobacco: Never Used  . Alcohol Use: Yes     Comment: rarely  . Drug Use: No  . Sexual Activity: Not Currently   Other Topics Concern  . 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 ROM normal. No CVA tenderness. Lungs: clear to auscultation bilaterally Heart: regular rate and rhythm, S1, S2 normal, no murmur, click, rub or gallop Abdomen: soft, non-tender; bowel sounds normal; no masses,  no organomegaly Pulses: 2+ and symmetric Skin: Skin color, texture, turgor normal. No rashes or lesions Lymph nodes: Cervical, supraclavicular, and axillary nodes normal.  Assessment and Plan:  Anxiety state With occasional crying spells and insomnia. Triggered by concern  for ailing mother.  There has been no escalation in use and never uses it more than once daily.  The risks and benefits of benzodiazepine use were reviewed with patient today including excessive sedation leading to respiratory depression,  impaired thinking/driving, and addiction.  Patient was reminded to avoid concurrent use with alcohol, to use medication only as needed and not to share with others  .  Refills given      A total of 25 minutes of face to face time was spent with patient more than half of which was spent in counselling and coordination of care   Updated Medication List Outpatient Encounter Prescriptions as of 10/17/2014  Medication Sig  . ALPRAZolam (XANAX) 0.5 MG tablet take 1 tablet by mouth at bedtime if needed for anxiety  . cetirizine (ZYRTEC) 10 MG tablet Take 10 mg by mouth as needed.   . Multiple Vitamins-Minerals (MULTIVITAMIN WITH MINERALS) tablet Take 1 tablet by mouth daily.  . [DISCONTINUED] ALPRAZolam (XANAX) 0.5 MG tablet take 1 tablet by mouth at bedtime if needed for anxiety  . [DISCONTINUED] polyethylene glycol powder (GLYCOLAX/MIRALAX) powder Take 255 g (1 Container total) by mouth once. (Patient not taking: Reported on 10/17/2014)   No facility-administered encounter medications on file as of 10/17/2014.     No orders of the defined types were placed in this encounter.    No Follow-up on file.

## 2014-10-17 NOTE — Progress Notes (Signed)
Pre-visit discussion using our clinic review tool. No additional management support is needed unless otherwise documented below in the visit note.  

## 2014-10-18 NOTE — Assessment & Plan Note (Signed)
With occasional crying spells and insomnia. Triggered by concern for ailing mother.  There has been no escalation in use and never uses it more than once daily.  The risks and benefits of benzodiazepine use were reviewed with patient today including excessive sedation leading to respiratory depression,  impaired thinking/driving, and addiction.  Patient was reminded to avoid concurrent use with alcohol, to use medication only as needed and not to share with others  .  Refills given

## 2014-12-22 ENCOUNTER — Other Ambulatory Visit (INDEPENDENT_AMBULATORY_CARE_PROVIDER_SITE_OTHER): Payer: BLUE CROSS/BLUE SHIELD

## 2014-12-22 ENCOUNTER — Telehealth: Payer: Self-pay | Admitting: *Deleted

## 2014-12-22 DIAGNOSIS — R5383 Other fatigue: Secondary | ICD-10-CM | POA: Diagnosis not present

## 2014-12-22 DIAGNOSIS — E785 Hyperlipidemia, unspecified: Secondary | ICD-10-CM

## 2014-12-22 DIAGNOSIS — R739 Hyperglycemia, unspecified: Secondary | ICD-10-CM | POA: Diagnosis not present

## 2014-12-22 LAB — CBC WITH DIFFERENTIAL/PLATELET
BASOS ABS: 0 10*3/uL (ref 0.0–0.1)
Basophils Relative: 0.6 % (ref 0.0–3.0)
EOS PCT: 2.9 % (ref 0.0–5.0)
Eosinophils Absolute: 0.2 10*3/uL (ref 0.0–0.7)
HEMATOCRIT: 43.5 % (ref 36.0–46.0)
Hemoglobin: 14.7 g/dL (ref 12.0–15.0)
LYMPHS PCT: 35.3 % (ref 12.0–46.0)
Lymphs Abs: 2.1 10*3/uL (ref 0.7–4.0)
MCHC: 33.7 g/dL (ref 30.0–36.0)
MCV: 82.4 fl (ref 78.0–100.0)
Monocytes Absolute: 0.5 10*3/uL (ref 0.1–1.0)
Monocytes Relative: 7.7 % (ref 3.0–12.0)
Neutro Abs: 3.2 10*3/uL (ref 1.4–7.7)
Neutrophils Relative %: 53.5 % (ref 43.0–77.0)
Platelets: 255 10*3/uL (ref 150.0–400.0)
RBC: 5.28 Mil/uL — ABNORMAL HIGH (ref 3.87–5.11)
RDW: 13.4 % (ref 11.5–15.5)
WBC: 6 10*3/uL (ref 4.0–10.5)

## 2014-12-22 LAB — LIPID PANEL
Cholesterol: 265 mg/dL — ABNORMAL HIGH (ref 0–200)
HDL: 64.2 mg/dL (ref >39.00)
LDL CALC: 183 mg/dL — AB (ref 0–99)
NONHDL: 200.8
TRIGLYCERIDES: 90 mg/dL (ref 0.0–149.0)
Total CHOL/HDL Ratio: 4
VLDL: 18 mg/dL (ref 0.0–40.0)

## 2014-12-22 LAB — COMPREHENSIVE METABOLIC PANEL
ALBUMIN: 4.5 g/dL (ref 3.5–5.2)
ALK PHOS: 84 U/L (ref 39–117)
ALT: 15 U/L (ref 0–35)
AST: 48 U/L — AB (ref 0–37)
BILIRUBIN TOTAL: 0.9 mg/dL (ref 0.2–1.2)
BUN: 18 mg/dL (ref 6–23)
CHLORIDE: 104 meq/L (ref 96–112)
CO2: 30 meq/L (ref 19–32)
Calcium: 9.7 mg/dL (ref 8.4–10.5)
Creatinine, Ser: 0.86 mg/dL (ref 0.40–1.20)
GFR: 72.14 mL/min (ref >60.00)
GLUCOSE: 97 mg/dL (ref 70–99)
POTASSIUM: 4 meq/L (ref 3.5–5.1)
Sodium: 141 mEq/L (ref 135–145)
Total Protein: 6.8 g/dL (ref 6.0–8.3)

## 2014-12-22 LAB — TSH: TSH: 2.74 u[IU]/mL (ref 0.35–4.50)

## 2014-12-22 LAB — HEMOGLOBIN A1C: Hgb A1c MFr Bld: 5.4 % (ref 4.6–6.5)

## 2014-12-22 NOTE — Telephone Encounter (Signed)
Labs and dX?  

## 2014-12-26 ENCOUNTER — Ambulatory Visit (INDEPENDENT_AMBULATORY_CARE_PROVIDER_SITE_OTHER): Payer: BLUE CROSS/BLUE SHIELD | Admitting: Internal Medicine

## 2014-12-26 ENCOUNTER — Encounter: Payer: Self-pay | Admitting: *Deleted

## 2014-12-26 ENCOUNTER — Encounter: Payer: Self-pay | Admitting: Internal Medicine

## 2014-12-26 VITALS — BP 118/74 | HR 64 | Temp 97.7°F | Resp 14 | Ht 62.5 in | Wt 145.2 lb

## 2014-12-26 DIAGNOSIS — E663 Overweight: Secondary | ICD-10-CM | POA: Diagnosis not present

## 2014-12-26 DIAGNOSIS — Z1239 Encounter for other screening for malignant neoplasm of breast: Secondary | ICD-10-CM

## 2014-12-26 DIAGNOSIS — Z0000 Encounter for general adult medical examination without abnormal findings: Secondary | ICD-10-CM

## 2014-12-26 NOTE — Patient Instructions (Signed)

## 2014-12-26 NOTE — Progress Notes (Signed)
Pre-visit discussion using our clinic review tool. No additional management support is needed unless otherwise documented below in the visit note.  

## 2014-12-27 NOTE — Assessment & Plan Note (Signed)
Annual wellness  exam was done as well as a comprehensive physical exam  .  During the course of the visit the patient was educated and counseled about appropriate screening and preventive services and screenings were brought up to date for cervical and breast cancer . Fasting labs were reviewed and recommendations provided,

## 2014-12-27 NOTE — Assessment & Plan Note (Signed)
A total of 30 minutes of face to face time was spent with patient more than half of which was spent in counselling about the current diet she is following   and coordination of care

## 2014-12-27 NOTE — Progress Notes (Signed)
Patient ID: Michelle SailorsLora T Mahoney, female    DOB: May 10, 1956  Age: 59 y.o. MRN: [id number]  The patient is here for annual  wellness examination and management of other chronic and acute problems.   The risk factors are reflected in the social history.  The roster of all physicians providing medical care to patient - is listed in the Snapshot section of the chart.  Activities of daily living:  The patient is 100% independent in all ADLs: dressing, toileting, feeding as well as independent mobility  Home safety : The patient has smoke detectors in the home. They wear seatbelts.  There are no firearms at home. There is no violence in the home.   There is no risks for hepatitis, STDs or HIV. There is no   history of blood transfusion. They have no travel history to infectious disease endemic areas of the world.  The patient has seen their dentist in the last six month. They have seen their eye doctor in the last year. They admit to slight hearing difficulty with regard to whispered voices and some television programs.  They have deferred audiologic testing in the last year.  They do not  have excessive sun exposure. Discussed the need for sun protection: hats, long sleeves and use of sunscreen if there is significant sun exposure.   Diet: the importance of a healthy diet is discussed. They do have a healthy diet.  The benefits of regular aerobic exercise were discussed. She walks 4 times per week ,  20 minutes.   Depression screen: there are no signs or vegative symptoms of depression- irritability, change in appetite, anhedonia, sadness/tearfullness.  Cognitive assessment: the patient manages all their financial and personal affairs and is actively engaged. They could relate day,date,year and events; recalled 2/3 objects at 3 minutes; performed clock-face test normally.  The following portions of the patient's history were reviewed and updated as appropriate: allergies, current medications, past family  history, past medical history,  past surgical history, past social history  and problem list.  Visual acuity was not assessed per patient preference since she has regular follow up with her ophthalmologist. Hearing and body mass index were assessed and reviewed.   During the course of the visit the patient was educated and counseled about appropriate screening and preventive services including : fall prevention , diabetes screening, nutrition counseling, colorectal cancer screening, and recommended immunizations.    CC: The primary encounter diagnosis was Breast cancer screening. Diagnoses of Overweight (BMI 25.0-29.9) and Routine general medical examination at a health care facility were also pertinent to this visit.    2) She has been struggling to get her BMI < 25 and has gone to Dr Schimmerhorn's weight loss clinic.  He has given her a 1000 calorie restricted diet, phentermine,  B12 injection and some other injection.  Depsite the phentemrine she is hungry a lot of the itme and frustrated. She exercises regularly    History Michelle Mahoney has a past medical history of Chicken pox.    She has past surgical history that includes Abdominal hysterectomy (2001) and Lesion excision (2008).   Her family history includes Cancer (age of onset: 3640) in her mother; Heart disease (age of onset: 5482) in her father; Mental illness (age of onset: 5580) in her father; Stroke (age of onset: 3387) in her mother.She reports that she has never smoked. She has never used smokeless tobacco. She reports that she drinks alcohol. She reports that she does not use illicit drugs.  Outpatient  Prescriptions Prior to Visit  Medication Sig Dispense Refill  . ALPRAZolam (XANAX) 0.5 MG tablet take 1 tablet by mouth at bedtime if needed for anxiety 30 tablet 5  . cetirizine (ZYRTEC) 10 MG tablet Take 10 mg by mouth as needed.     . Multiple Vitamins-Minerals (MULTIVITAMIN WITH MINERALS) tablet Take 1 tablet by mouth daily.     No  facility-administered medications prior to visit.    Review of Systems   Patient denies headache, fevers, malaise, unintentional weight loss, skin rash, eye pain, sinus congestion and sinus pain, sore throat, dysphagia,  hemoptysis , cough, dyspnea, wheezing, chest pain, palpitations, orthopnea, edema, abdominal pain, nausea, melena, diarrhea, constipation, flank pain, dysuria, hematuria, urinary  Frequency, nocturia, numbness, tingling, seizures,  Focal weakness, Loss of consciousness,  Tremor, insomnia, depression, anxiety, and suicidal ideation.      Objective:  BP 118/74 mmHg  Pulse 64  Temp(Src) 97.7 F (36.5 C) (Oral)  Resp 14  Ht 5' 2.5" (1.588 m)  Wt 145 lb 4 oz (65.885 kg)  BMI 26.13 kg/m2  SpO2 98%  Physical Exam   General appearance: alert, cooperative and appears stated age Head: Normocephalic, without obvious abnormality, atraumatic Eyes: conjunctivae/corneas clear. PERRL, EOM's intact. Fundi benign. Ears: normal TM's and external ear canals both ears Nose: Nares normal. Septum midline. Mucosa normal. No drainage or sinus tenderness. Throat: lips, mucosa, and tongue normal; teeth and gums normal Neck: no adenopathy, no carotid bruit, no JVD, supple, symmetrical, trachea midline and thyroid not enlarged, symmetric, no tenderness/mass/nodules Lungs: clear to auscultation bilaterally Breasts: normal appearance, no masses or tenderness Heart: regular rate and rhythm, S1, S2 normal, no murmur, click, rub or gallop Abdomen: soft, non-tender; bowel sounds normal; no masses,  no organomegaly Extremities: extremities normal, atraumatic, no cyanosis or edema Pulses: 2+ and symmetric Skin: Skin color, texture, turgor normal. No rashes or lesions Neurologic: Alert and oriented X 3, normal strength and tone. Normal symmetric reflexes. Normal coordination and gait.     Assessment & Plan:   Problem List Items Addressed This Visit      Unprioritized   Overweight (BMI  25.0-29.9)    A total of 30 minutes of face to face time was spent with patient more than half of which was spent in counselling about the current diet she is following   and coordination of care       Routine general medical examination at a health care facility    Annual wellness  exam was done as well as a comprehensive physical exam  .  During the course of the visit the patient was educated and counseled about appropriate screening and preventive services and screenings were brought up to date for cervical and breast cancer . Fasting labs were reviewed and recommendations provided,        Other Visit Diagnoses    Breast cancer screening    -  Primary    Relevant Orders    MM DIGITAL SCREENING BILATERAL       I am having Michelle Mahoney maintain her multivitamin with minerals, cetirizine, ALPRAZolam, and phentermine.  Meds ordered this encounter  Medications  . phentermine (ADIPEX-P) 37.5 MG tablet    Sig: Take 37.5 mg by mouth daily before breakfast.    There are no discontinued medications.  Follow-up: Return in about 1 year (around 12/26/2015).   Sherlene Shams, MD

## 2015-01-16 ENCOUNTER — Encounter: Payer: Self-pay | Admitting: Internal Medicine

## 2015-02-01 ENCOUNTER — Ambulatory Visit
Admission: RE | Admit: 2015-02-01 | Discharge: 2015-02-01 | Disposition: A | Discharge status: Home / Self Care | Payer: BLUE CROSS/BLUE SHIELD | Source: Ambulatory Visit | Attending: Internal Medicine | Admitting: Internal Medicine

## 2015-02-01 ENCOUNTER — Other Ambulatory Visit: Payer: Self-pay | Admitting: Internal Medicine

## 2015-02-01 DIAGNOSIS — Z1231 Encounter for screening mammogram for malignant neoplasm of breast: Secondary | ICD-10-CM | POA: Diagnosis not present

## 2015-02-01 DIAGNOSIS — Z1239 Encounter for other screening for malignant neoplasm of breast: Secondary | ICD-10-CM

## 2015-05-30 ENCOUNTER — Other Ambulatory Visit: Payer: Self-pay | Admitting: Internal Medicine

## 2015-05-31 NOTE — Telephone Encounter (Signed)
Ok to refill,  Authorized in epic 

## 2015-09-07 ENCOUNTER — Telehealth: Payer: Self-pay | Admitting: Internal Medicine

## 2015-09-07 MED ORDER — TRAZODONE HCL 50 MG PO TABS: 25.0000 mg | ORAL_TABLET | Freq: Every evening | ORAL | Status: DC | PRN

## 2015-09-07 NOTE — Telephone Encounter (Signed)
Patient notified and voiced understanding.

## 2015-09-07 NOTE — Telephone Encounter (Signed)
The only thing I can prescribe without seeing her is  Trazodone.  i will send to pharmacy.  Start with 1/2 tablet daily 30 minutes before sleep time.  Increase to full tablet after a few days if no improvemennt

## 2015-09-07 NOTE — Telephone Encounter (Signed)
The patient is wanting medication to help her sleep at night . She has been unable to sleep.

## 2015-09-07 NOTE — Telephone Encounter (Signed)
Please advise, thanks.

## 2015-12-29 ENCOUNTER — Encounter: Payer: BLUE CROSS/BLUE SHIELD | Admitting: Internal Medicine

## 2016-01-04 ENCOUNTER — Other Ambulatory Visit: Payer: Self-pay | Admitting: Internal Medicine

## 2016-01-04 MED ORDER — ALPRAZOLAM 0.5 MG PO TABS: ORAL_TABLET | ORAL | 0 refills | Status: DC

## 2016-01-04 NOTE — Telephone Encounter (Signed)
Faxed thanks!

## 2016-01-04 NOTE — Telephone Encounter (Signed)
Please advise, last refill was 05/31/15 #30 5 refills, appt is scheduled for 01/22/16.  Thanks

## 2016-01-04 NOTE — Telephone Encounter (Signed)
Pt called wanting to know if a refill can be called in? Pt states being that Dr Darrick Huntsman rescheduled her appt.   Medication is ALPRAZolam Prudy Feeler) 0.5 MG tablet  Ph[facility]acy is [facility] [address] - [place], [place] - [address]  Call pt @ [phone]. Thank you!

## 2016-01-22 ENCOUNTER — Encounter: Payer: BLUE CROSS/BLUE SHIELD | Admitting: Internal Medicine

## 2016-02-09 ENCOUNTER — Encounter (INDEPENDENT_AMBULATORY_CARE_PROVIDER_SITE_OTHER): Payer: Self-pay

## 2016-02-09 ENCOUNTER — Ambulatory Visit (INDEPENDENT_AMBULATORY_CARE_PROVIDER_SITE_OTHER): Payer: BLUE CROSS/BLUE SHIELD | Admitting: Internal Medicine

## 2016-02-09 ENCOUNTER — Encounter: Payer: Self-pay | Admitting: Internal Medicine

## 2016-02-09 VITALS — BP 130/80 | HR 52 | Temp 97.6°F | Resp 18 | Ht 62.0 in | Wt 135.1 lb

## 2016-02-09 DIAGNOSIS — E785 Hyperlipidemia, unspecified: Secondary | ICD-10-CM

## 2016-02-09 DIAGNOSIS — S86811A Strain of other muscle(s) and tendon(s) at lower leg level, right leg, initial encounter: Secondary | ICD-10-CM

## 2016-02-09 DIAGNOSIS — Z0000 Encounter for general adult medical examination without abnormal findings: Secondary | ICD-10-CM

## 2016-02-09 DIAGNOSIS — R740 Nonspecific elevation of levels of transaminase and lactic acid dehydrogenase [LDH]: Secondary | ICD-10-CM | POA: Diagnosis not present

## 2016-02-09 DIAGNOSIS — F5105 Insomnia due to other mental disorder: Secondary | ICD-10-CM

## 2016-02-09 DIAGNOSIS — R7401 Elevation of levels of liver transaminase levels: Secondary | ICD-10-CM

## 2016-02-09 DIAGNOSIS — F411 Generalized anxiety disorder: Secondary | ICD-10-CM

## 2016-02-09 DIAGNOSIS — E559 Vitamin D deficiency, unspecified: Secondary | ICD-10-CM | POA: Diagnosis not present

## 2016-02-09 DIAGNOSIS — Z1239 Encounter for other screening for malignant neoplasm of breast: Secondary | ICD-10-CM | POA: Diagnosis not present

## 2016-02-09 DIAGNOSIS — F419 Anxiety disorder, unspecified: Secondary | ICD-10-CM

## 2016-02-09 LAB — COMPREHENSIVE METABOLIC PANEL
ALK PHOS: 69 U/L (ref 39–117)
ALT: 13 U/L (ref 0–35)
AST: 26 U/L (ref 0–37)
Albumin: 4.4 g/dL (ref 3.5–5.2)
BILIRUBIN TOTAL: 0.5 mg/dL (ref 0.2–1.2)
BUN: 15 mg/dL (ref 6–23)
CO2: 30 mEq/L (ref 19–32)
CREATININE: 0.77 mg/dL (ref 0.40–1.20)
Calcium: 9.2 mg/dL (ref 8.4–10.5)
Chloride: 105 mEq/L (ref 96–112)
GFR: 81.64 mL/min (ref >60.00)
GLUCOSE: 103 mg/dL — AB (ref 70–99)
POTASSIUM: 3.9 meq/L (ref 3.5–5.1)
SODIUM: 141 meq/L (ref 135–145)
TOTAL PROTEIN: 6.8 g/dL (ref 6.0–8.3)

## 2016-02-09 LAB — LIPID PANEL
CHOLESTEROL: 243 mg/dL — AB (ref 0–200)
HDL: 73.4 mg/dL (ref >39.00)
LDL Cholesterol: 148 mg/dL — ABNORMAL HIGH (ref 0–99)
NonHDL: 170.05
TRIGLYCERIDES: 110 mg/dL (ref 0.0–149.0)
Total CHOL/HDL Ratio: 3
VLDL: 22 mg/dL (ref 0.0–40.0)

## 2016-02-09 LAB — CK: Total CK: 129 U/L (ref 7–177)

## 2016-02-09 LAB — TSH: TSH: 3.04 u[IU]/mL (ref 0.35–4.50)

## 2016-02-09 LAB — VITAMIN D 25 HYDROXY (VIT D DEFICIENCY, FRACTURES): VITD: 40.16 ng/mL (ref 30.00–100.00)

## 2016-02-09 MED ORDER — ALPRAZOLAM 0.5 MG PO TABS: ORAL_TABLET | ORAL | 2 refills | Status: DC

## 2016-02-09 MED ORDER — ZOLPIDEM TARTRATE ER 6.25 MG PO TBCR: 6.2500 mg | EXTENDED_RELEASE_TABLET | Freq: Every evening | ORAL | 0 refills | Status: DC | PRN

## 2016-02-09 MED ORDER — ALPRAZOLAM 0.5 MG PO TABS: ORAL_TABLET | ORAL | 0 refills | Status: DC

## 2016-02-09 NOTE — Progress Notes (Signed)
Pre-visit discussion using our clinic review tool. No additional management support is needed unless otherwise documented below in the visit note.  

## 2016-02-09 NOTE — Progress Notes (Signed)
Patient ID: Michelle Mahoney, female    DOB: 10/11/1955  Age: 60 y.o. MRN: [id number]  The patient is here for annual wellness examination and management of other chronic and acute problems.   The risk factors are reflected in the social history.  The roster of all physicians providing medical care to patient - is listed in the Snapshot section of the chart.   Home safety : The patient has smoke detectors in the home. They wear seatbelts.  There are no firearms at home. There is no violence in the home.   There is no risks for hepatitis, STDs or HIV. There is no   history of blood transfusion. They have no travel history to infectious disease endemic areas of the world.  The patient has seen their dentist in the last six month. They have seen their eye doctor in the last year.    They do not  have excessive sun exposure. Discussed the need for sun protection: hats, long sleeves and use of sunscreen if there is significant sun exposure.   Diet: the importance of a healthy diet is discussed. They do have a healthy diet.  The benefits of regular aerobic exercise were discussed. She walks 4 times per week ,  20 minutes.   Depression screen: there are no signs or vegative symptoms of depression- irritability, change in appetite, anhedonia, sadness/tearfullness. The following portions of the patient's history were reviewed and updated as appropriate: allergies, current medications, past family history, past medical history,  past surgical history, past social history  and problem list.  Visual acuity was not assessed per patient preference since she has regular follow up with her ophthalmologist. Hearing and body mass index were assessed and reviewed.   During the course of the visit the patient was educated and counseled about appropriate screening and preventive services including : fall prevention , diabetes screening, nutrition counseling, colorectal cancer screening, and recommended  immunizations.    CC: The primary encounter diagnosis was Screening breast examination. Diagnoses of Elevated AST (SGOT), Hyperlipidemia, Vitamin D deficiency, Insomnia secondary to anxiety, Routine general medical examination at a health care facility, Anxiety state, and Strain of calf muscle, right, initial encounter were also pertinent to this visit.  Insomnia. Has been taking trazodone as prescribed with no significant improvement.  Has not tried any other medications.  Waking up early   Calf pain.  persistent for several weeks,  Occurred during workout,  Not improving.  Affecting her running. No prior evaluation .     History Sayler has a past medical history of Chicken pox.   She has a past surgical history that includes Abdominal hysterectomy (2001) and Lesion excision (2008).   Her family history includes Cancer (age of onset: 48) in her mother; Heart disease (age of onset: 32) in her father; Mental illness (age of onset: 75) in her father; Stroke (age of onset: 34) in her mother.She reports that she has never smoked. She has never used smokeless tobacco. She reports that she drinks alcohol. She reports that she does not use drugs.  Outpatient Medications Prior to Visit  Medication Sig Dispense Refill  . cetirizine (ZYRTEC) 10 MG tablet Take 10 mg by mouth as needed.     . Multiple Vitamins-Minerals (MULTIVITAMIN WITH MINERALS) tablet Take 1 tablet by mouth daily.    . phentermine (ADIPEX-P) 37.5 MG tablet Take 37.5 mg by mouth daily before breakfast.    . ALPRAZolam (XANAX) 0.5 MG tablet take 1 tablet by mouth at  bedtime if needed for anxiety 30 tablet 0  . traZODone (DESYREL) 50 MG tablet Take 0.5-1 tablets (25-50 mg total) by mouth at bedtime as needed for sleep. 30 tablet 3   No facility-administered medications prior to visit.     Review of Systems   Patient denies headache, fevers, malaise, unintentional weight loss, skin rash, eye pain, sinus congestion and sinus pain, sore  throat, dysphagia,  hemoptysis , cough, dyspnea, wheezing, chest pain, palpitations, orthopnea, edema, abdominal pain, nausea, melena, diarrhea, constipation, flank pain, dysuria, hematuria, urinary  Frequency, nocturia, numbness, tingling, seizures,  Focal weakness, Loss of consciousness,  Tremor, , depression,  and suicidal ideation.      Objective:  BP 130/80   Pulse (!) 52   Temp 97.6 F (36.4 C) (Oral)   Resp 18   Ht 5\' 2"  (1.575 m)   Wt 135 lb 2 oz (61.3 kg)   SpO2 99%   BMI 24.71 kg/m   Physical Exam  General appearance: alert, cooperative and appears stated age Head: Normocephalic, without obvious abnormality, atraumatic Eyes: conjunctivae/corneas clear. PERRL, EOM's intact. Fundi benign. Ears: normal TM's and external ear canals both ears Nose: Nares normal. Septum midline. Mucosa normal. No drainage or sinus tenderness. Throat: lips, mucosa, and tongue normal; teeth and gums normal Neck: no adenopathy, no carotid bruit, no JVD, supple, symmetrical, trachea midline and thyroid not enlarged, symmetric, no tenderness/mass/nodules Lungs: clear to auscultation bilaterally Breasts: normal appearance, no masses or tenderness Heart: regular rate and rhythm, S1, S2 normal, no murmur, click, rub or gallop Abdomen: soft, non-tender; bowel sounds normal; no masses,  no organomegaly Extremities: extremities normal, atraumatic, no cyanosis or edema Pulses: 2+ and symmetric Skin: Skin color, texture, turgor normal. No rashes or lesions Neurologic: Alert and oriented X 3, normal strength and tone. Normal symmetric reflexes. Normal coordination and gait.   Assessment & Plan:   Problem List Items Addressed This Visit    Anxiety state    With occasional crying spells and insomnia. Triggered by concern for ailing mother.  There has been no escalation in use and never uses it more than once daily.  The risks and benefits of benzodiazepine use were reviewed with patient today including  excessive sedation leading to respiratory depression,  impaired thinking/driving, and addiction.  Patient was reminded to avoid concurrent use with alcohol, to use medication only as needed and not to share with others  .  Refills given         Relevant Medications   ALPRAZolam (XANAX) 0.5 MG tablet   Routine general medical examination at a health care facility    Annual comprehensive preventive exam was done as well as an evaluation and management of chronic conditions .  During the course of the visit the patient was educated and counseled about appropriate screening and preventive services including :  diabetes screening, lipid analysis with projected  10 year  risk for CAD , nutrition counseling, breast, cervical and colorectal cancer screening, and recommended immunizations.  Printed recommendations for health maintenance screenings was given.  Lab Results  Component Value Date   CHOL 243 (H) 02/09/2016   HDL 73.40 02/09/2016   LDLCALC 148 (H) 02/09/2016   TRIG 110.0 02/09/2016   CHOLHDL 3 02/09/2016   Lab Results  Component Value Date   TSH 3.04 02/09/2016   Lab Results  Component Value Date   CREATININE 0.77 02/09/2016   Lab Results  Component Value Date   ALT 13 02/09/2016   AST 26 02/09/2016  ALKPHOS 69 02/09/2016   BILITOT 0.5 02/09/2016   Lab Results  Component Value Date   CKTOTAL 129 02/09/2016         Insomnia secondary to anxiety    Advised to increase trazodone dose as needed up to 100 mg ,,  Take one hour before bedtime.Discussed natural remedies for insomnia including herbal tea and melatonin.  Reviewd principles of good sleep hygiene      Relevant Medications   ALPRAZolam (XANAX) 0.5 MG tablet   Strain of calf muscle    persistent for several weeks, occurring during workout..  She is requesting sports medicine evaluation .  Referral to Terrilee Files        Other Visit Diagnoses    Screening breast examination    -  Primary   Relevant Orders   MM  Digital Screening   Elevated AST (SGOT)       Relevant Orders   CK (Completed)   Comprehensive metabolic panel (Completed)   Hyperlipidemia       Relevant Orders   Lipid panel (Completed)   TSH (Completed)   Vitamin D deficiency       Relevant Orders   VITAMIN D 25 Hydroxy (Vit-D Deficiency, Fractures) (Completed)      I have discontinued Ms. Floresca's traZODone, ALPRAZolam, ALPRAZolam, and zolpidem. I have also changed her ALPRAZolam. Additionally, I am having her maintain her multivitamin with minerals, cetirizine, and phentermine.  Meds ordered this encounter  Medications  . DISCONTD: ALPRAZolam (XANAX) 0.5 MG tablet    Sig: take 1 tablet by mouth at bedtime if needed for anxiety    Dispense:  30 tablet    Refill:  0  . DISCONTD: zolpidem (AMBIEN CR) 6.25 MG CR tablet    Sig: Take 1 tablet (6.25 mg total) by mouth at bedtime as needed for sleep.    Dispense:  30 tablet    Refill:  0  . DISCONTD: ALPRAZolam (XANAX) 0.5 MG tablet    Sig: take 1 tablet by mouth at bedtime if needed for anxiety    Dispense:  30 tablet    Refill:  2  . ALPRAZolam (XANAX) 0.5 MG tablet    Sig: take 1 tablet by mouth  Daily  as needed for anxiety    Dispense:  30 tablet    Refill:  2    Medications Discontinued During This Encounter  Medication Reason  . ALPRAZolam (XANAX) 0.5 MG tablet Reorder  . traZODone (DESYREL) 50 MG tablet   . ALPRAZolam (XANAX) 0.5 MG tablet Reorder  . ALPRAZolam (XANAX) 0.5 MG tablet Reorder  . zolpidem (AMBIEN CR) 6.25 MG CR tablet     Follow-up: No Follow-up on file.   Sherlene Shams, MD

## 2016-02-09 NOTE — Patient Instructions (Signed)
Trial of ambien Cr low dose for insomnia  .Do not use every night!  It is addicting.    Trun your lap top off 2 hours before bedtime  Menopause is a normal process in which your reproductive ability comes to an end. This process happens gradually over a span of months to years, usually between the ages of 47 and 59. Menopause is complete when you have missed 12 consecutive menstrual periods. It is important to talk with your health care provider about some of the most common conditions that affect postmenopausal women, such as heart disease, cancer, and bone loss (osteoporosis). Adopting a healthy lifestyle and getting preventive care can help to promote your health and wellness. Those actions can also lower your chances of developing some of these common conditions. WHAT SHOULD I KNOW ABOUT MENOPAUSE? During menopause, you may experience a number of symptoms, such as:  Moderate-to-severe hot flashes.  Night sweats.  Decrease in sex drive.  Mood swings.  Headaches.  Tiredness.  Irritability.  Memory problems.  Insomnia. Choosing to treat or not to treat menopausal changes is an individual decision that you make with your health care provider. WHAT SHOULD I KNOW ABOUT HORMONE REPLACEMENT THERAPY AND SUPPLEMENTS? Hormone therapy products are effective for treating symptoms that are associated with menopause, such as hot flashes and night sweats. Hormone replacement carries certain risks, especially as you become older. If you are thinking about using estrogen or estrogen with progestin treatments, discuss the benefits and risks with your health care provider. WHAT SHOULD I KNOW ABOUT HEART DISEASE AND STROKE? Heart disease, heart attack, and stroke become more likely as you age. This may be due, in part, to the hormonal changes that your body experiences during menopause. These can affect how your body processes dietary fats, triglycerides, and cholesterol. Heart attack and stroke are  both medical emergencies. There are many things that you can do to help prevent heart disease and stroke:  Have your blood pressure checked at least every 1-2 years. High blood pressure causes heart disease and increases the risk of stroke.  If you are 59-34 years old, ask your health care provider if you should take aspirin to prevent a heart attack or a stroke.  Do not use any tobacco products, including cigarettes, chewing tobacco, or electronic cigarettes. If you need help quitting, ask your health care provider.  It is important to eat a healthy diet and maintain a healthy weight.  Be sure to include plenty of vegetables, fruits, low-fat dairy products, and lean protein.  Avoid eating foods that are high in solid fats, added sugars, or salt (sodium).  Get regular exercise. This is one of the most important things that you can do for your health.  Try to exercise for at least 150 minutes each week. The type of exercise that you do should increase your heart rate and make you sweat. This is known as moderate-intensity exercise.  Try to do strengthening exercises at least twice each week. Do these in addition to the moderate-intensity exercise.  Know your numbers.Ask your health care provider to check your cholesterol and your blood glucose. Continue to have your blood tested as directed by your health care provider. WHAT SHOULD I KNOW ABOUT CANCER SCREENING? There are several types of cancer. Take the following steps to reduce your risk and to catch any cancer development as early as possible. Breast Cancer  Practice breast self-awareness.  This means understanding how your breasts normally appear and feel.  It  also means doing regular breast self-exams. Let your health care provider know about any changes, no matter how small.  If you are 28 or older, have a clinician do a breast exam (clinical breast exam or CBE) every year. Depending on your age, family history, and medical  history, it may be recommended that you also have a yearly breast X-ray (mammogram).  If you have a family history of breast cancer, talk with your health care provider about genetic screening.  If you are at high risk for breast cancer, talk with your health care provider about having an MRI and a mammogram every year.  Breast cancer (BRCA) gene test is recommended for women who have family members with BRCA-related cancers. Results of the assessment will determine the need for genetic counseling and BRCA1 and for BRCA2 testing. BRCA-related cancers include these types:  Breast. This occurs in males or females.  Ovarian.  Tubal. This may also be called fallopian tube cancer.  Cancer of the abdominal or pelvic lining (peritoneal cancer).  Prostate.  Pancreatic. Cervical, Uterine, and Ovarian Cancer Your health care provider may recommend that you be screened regularly for cancer of the pelvic organs. These include your ovaries, uterus, and vagina. This screening involves a pelvic exam, which includes checking for microscopic changes to the surface of your cervix (Pap test).  For women ages 21-65, health care providers may recommend a pelvic exam and a Pap test every three years. For women ages 37-65, they may recommend the Pap test and pelvic exam, combined with testing for human papilloma virus (HPV), every five years. Some types of HPV increase your risk of cervical cancer. Testing for HPV may also be done on women of any age who have unclear Pap test results.  Other health care providers may not recommend any screening for nonpregnant women who are considered low risk for pelvic cancer and have no symptoms. Ask your health care provider if a screening pelvic exam is right for you.  If you have had past treatment for cervical cancer or a condition that could lead to cancer, you need Pap tests and screening for cancer for at least 20 years after your treatment. If Pap tests have been  discontinued for you, your risk factors (such as having a new sexual partner) need to be reassessed to determine if you should start having screenings again. Some women have medical problems that increase the chance of getting cervical cancer. In these cases, your health care provider may recommend that you have screening and Pap tests more often.  If you have a family history of uterine cancer or ovarian cancer, talk with your health care provider about genetic screening.  If you have vaginal bleeding after reaching menopause, tell your health care provider.  There are currently no reliable tests available to screen for ovarian cancer. Lung Cancer Lung cancer screening is recommended for adults 96-30 years old who are at high risk for lung cancer because of a history of smoking. A yearly low-dose CT scan of the lungs is recommended if you:  Currently smoke.  Have a history of at least 30 pack-years of smoking and you currently smoke or have quit within the past 15 years. A pack-year is smoking an average of one pack of cigarettes per day for one year. Yearly screening should:  Continue until it has been 15 years since you quit.  Stop if you develop a health problem that would prevent you from having lung cancer treatment. Colorectal Cancer  This type of cancer can be detected and can often be prevented.  Routine colorectal cancer screening usually begins at age 47 and continues through age 77.  If you have risk factors for colon cancer, your health care provider may recommend that you be screened at an earlier age.  If you have a family history of colorectal cancer, talk with your health care provider about genetic screening.  Your health care provider may also recommend using home test kits to check for hidden blood in your stool.  A small camera at the end of a tube can be used to examine your colon directly (sigmoidoscopy or colonoscopy). This is done to check for the earliest forms  of colorectal cancer.  Direct examination of the colon should be repeated every 5-10 years until age 64. However, if early forms of precancerous polyps or small growths are found or if you have a family history or genetic risk for colorectal cancer, you may need to be screened more often. Skin Cancer  Check your skin from head to toe regularly.  Monitor any moles. Be sure to tell your health care provider:  About any new moles or changes in moles, especially if there is a change in a mole's shape or color.  If you have a mole that is larger than the size of a pencil eraser.  If any of your family members has a history of skin cancer, especially at a young age, talk with your health care provider about genetic screening.  Always use sunscreen. Apply sunscreen liberally and repeatedly throughout the day.  Whenever you are outside, protect yourself by wearing long sleeves, pants, a wide-brimmed hat, and sunglasses. WHAT SHOULD I KNOW ABOUT OSTEOPOROSIS? Osteoporosis is a condition in which bone destruction happens more quickly than new bone creation. After menopause, you may be at an increased risk for osteoporosis. To help prevent osteoporosis or the bone fractures that can happen because of osteoporosis, the following is recommended:  If you are 86-72 years old, get at least 1,000 mg of calcium and at least 600 mg of vitamin D per day.  If you are older than age 63 but younger than age 87, get at least 1,200 mg of calcium and at least 600 mg of vitamin D per day.  If you are older than age 50, get at least 1,200 mg of calcium and at least 800 mg of vitamin D per day. Smoking and excessive alcohol intake increase the risk of osteoporosis. Eat foods that are rich in calcium and vitamin D, and do weight-bearing exercises several times each week as directed by your health care provider. WHAT SHOULD I KNOW ABOUT HOW MENOPAUSE AFFECTS [place]? Depression may occur at any age, but it is  more common as you become older. Common symptoms of depression include:  Low or sad mood.  Changes in sleep patterns.  Changes in appetite or eating patterns.  Feeling an overall lack of motivation or enjoyment of activities that you previously enjoyed.  Frequent crying spells. Talk with your health care provider if you think that you are experiencing depression. WHAT SHOULD I KNOW ABOUT IMMUNIZATIONS? It is important that you get and maintain your immunizations. These include:  Tetanus, diphtheria, and pertussis (Tdap) booster vaccine.  Influenza every year before the flu season begins.  Pneumonia vaccine.  Shingles vaccine. Your health care provider may also recommend other immunizations.   This information is not intended to replace advice given to you by your health care  provider. Make sure you discuss any questions you have with your health care provider.   Document Released: 07/19/2005 Document Revised: 06/17/2014 Document Reviewed: 01/27/2014 Elsevier Interactive Patient Education 2016 [employer]  

## 2016-02-12 DIAGNOSIS — S86819A Strain of other muscle(s) and tendon(s) at lower leg level, unspecified leg, initial encounter: Secondary | ICD-10-CM | POA: Insufficient documentation

## 2016-02-12 DIAGNOSIS — F5105 Insomnia due to other mental disorder: Secondary | ICD-10-CM

## 2016-02-12 DIAGNOSIS — F419 Anxiety disorder, unspecified: Secondary | ICD-10-CM | POA: Insufficient documentation

## 2016-02-12 NOTE — Assessment & Plan Note (Signed)
Annual comprehensive preventive exam was done as well as an evaluation and management of chronic conditions .  During the course of the visit the patient was educated and counseled about appropriate screening and preventive services including :  diabetes screening, lipid analysis with projected  10 year  risk for CAD , nutrition counseling, breast, cervical and colorectal cancer screening, and recommended immunizations.  Printed recommendations for health maintenance screenings was given.  Lab Results  Component Value Date   CHOL 243 (H) 02/09/2016   HDL 73.40 02/09/2016   LDLCALC 148 (H) 02/09/2016   TRIG 110.0 02/09/2016   CHOLHDL 3 02/09/2016   Lab Results  Component Value Date   TSH 3.04 02/09/2016   Lab Results  Component Value Date   CREATININE 0.77 02/09/2016   Lab Results  Component Value Date   ALT 13 02/09/2016   AST 26 02/09/2016   ALKPHOS 69 02/09/2016   BILITOT 0.5 02/09/2016   Lab Results  Component Value Date   CKTOTAL 129 02/09/2016

## 2016-02-12 NOTE — Assessment & Plan Note (Signed)
persistent for several weeks, occurring during workout..  She is requesting sports medicine evaluation .  Referral to Terrilee FilesZach Smith

## 2016-02-12 NOTE — Assessment & Plan Note (Signed)
With occasional crying spells and insomnia. Triggered by concern for ailing mother.  There has been no escalation in use and never uses it more than once daily.  The risks and benefits of benzodiazepine use were reviewed with patient today including excessive sedation leading to respiratory depression,  impaired thinking/driving, and addiction.  Patient was reminded to avoid concurrent use with alcohol, to use medication only as needed and not to share with others  .  Refills given    

## 2016-02-12 NOTE — Assessment & Plan Note (Addendum)
Advised to increase trazodone dose as needed up to 100 mg ,,  Take one hour before bedtime.Discussed natural remedies for insomnia including herbal tea and melatonin.  Reviewd principles of good sleep hygiene

## 2016-03-04 ENCOUNTER — Other Ambulatory Visit: Payer: Self-pay | Admitting: Internal Medicine

## 2016-03-04 DIAGNOSIS — Z1231 Encounter for screening mammogram for malignant neoplasm of breast: Secondary | ICD-10-CM

## 2016-03-25 ENCOUNTER — Other Ambulatory Visit: Payer: Self-pay | Admitting: Internal Medicine

## 2016-03-25 MED ORDER — ZOLPIDEM TARTRATE ER 6.25 MG PO TBCR: 6.2500 mg | EXTENDED_RELEASE_TABLET | Freq: Every evening | ORAL | 0 refills | Status: DC | PRN

## 2016-03-25 NOTE — Telephone Encounter (Signed)
Called patient is requesting to try Ambien CR again if ok to fill. Last try medication was to expensive, the cheapist [facility] to [facility] which I have set in chart and pended medication for MD approval.

## 2016-03-25 NOTE — Telephone Encounter (Signed)
Pt called and was asking if there was something for sleep, she said when she had filled it was a little too expensive. Please advise, thank you!  Call pt @ [phone]  Pharmacy - [facility] [address], [place], [place] [id number] [phone]

## 2016-03-26 ENCOUNTER — Telehealth: Payer: Self-pay | Admitting: *Deleted

## 2016-03-26 NOTE — Telephone Encounter (Signed)
Read pt the previous note, about medication for sleep

## 2016-03-28 ENCOUNTER — Ambulatory Visit: Payer: BLUE CROSS/BLUE SHIELD

## 2016-04-01 ENCOUNTER — Encounter (INDEPENDENT_AMBULATORY_CARE_PROVIDER_SITE_OTHER): Payer: Self-pay

## 2016-04-01 ENCOUNTER — Ambulatory Visit (INDEPENDENT_AMBULATORY_CARE_PROVIDER_SITE_OTHER): Payer: BLUE CROSS/BLUE SHIELD | Admitting: Internal Medicine

## 2016-04-01 ENCOUNTER — Encounter: Payer: Self-pay | Admitting: Internal Medicine

## 2016-04-01 DIAGNOSIS — F411 Generalized anxiety disorder: Secondary | ICD-10-CM

## 2016-04-01 DIAGNOSIS — F419 Anxiety disorder, unspecified: Secondary | ICD-10-CM

## 2016-04-01 DIAGNOSIS — F5105 Insomnia due to other mental disorder: Secondary | ICD-10-CM

## 2016-04-01 MED ORDER — ESCITALOPRAM OXALATE 10 MG PO TABS: 10.0000 mg | ORAL_TABLET | Freq: Every day | ORAL | 3 refills | Status: DC

## 2016-04-01 MED ORDER — ZOLPIDEM TARTRATE ER 12.5 MG PO TBCR: 12.5000 mg | EXTENDED_RELEASE_TABLET | Freq: Every evening | ORAL | 3 refills | Status: DC | PRN

## 2016-04-01 NOTE — Progress Notes (Signed)
Subjective:  Patient ID: Michelle Mahoney, female    DOB: 08/20/1955  Age: 60 y.o. MRN: [id number]  CC: Diagnoses of Anxiety state and Insomnia secondary to anxiety were pertinent to this visit.  HPI Michelle Mahoney presents for follow up on 1) anxiety with insomnia    Anxiety:  She has been much more irritable for the past several weeks.  She is using alprazolam once daily in the morning but having increased tension and negativity and not sleeping well.   She cites home stressors as major contributors:     Her husband , who is 54,  has poorly controlled diabetes with complications and has to have his foot amputated in November. He has made renovations to the basement without her consent or input and plans to recover in the basement .  Patient is very upset about his presumptions that she will sleep in the recliner in the basement with him, since she already has poor sleep .  He has also  Invited his daughter , the patient's  stepdaughter to move back in with them during her impending divorce.      Has tried using using alprazolam  at bedtime but wakes up frequently. Has tried trazodone at 50 mg but wakes up. Was prescribed ambien cr at last visit but the [facility] cost was too $$$ (60) and she needs a new r to have it filled at [facility] since [facility] would not transfer the rx,     Outpatient Medications Prior to Visit  Medication Sig Dispense Refill  . ALPRAZolam (XANAX) 0.5 MG tablet take 1 tablet by mouth  Daily  as needed for anxiety 30 tablet 2  . cetirizine (ZYRTEC) 10 MG tablet Take 10 mg by mouth as needed.     . Multiple Vitamins-Minerals (MULTIVITAMIN WITH MINERALS) tablet Take 1 tablet by mouth daily.    . phentermine (ADIPEX-P) 37.5 MG tablet Take 37.5 mg by mouth daily before breakfast.    . zolpidem (AMBIEN CR) 6.25 MG CR tablet Take 1 tablet (6.25 mg total) by mouth at bedtime as needed for sleep. 30 tablet 0   No facility-administered medications prior to visit.     Review  of Systems;  Patient denies headache, fevers, malaise, unintentional weight loss, skin rash, eye pain, sinus congestion and sinus pain, sore throat, dysphagia,  hemoptysis , cough, dyspnea, wheezing, chest pain, palpitations, orthopnea, edema, abdominal pain, nausea, melena, diarrhea, constipation, flank pain, dysuria, hematuria, urinary  Frequency, nocturia, numbness, tingling, seizures,  Focal weakness, Loss of consciousness,  Tremor, insomnia, depression, anxiety, and suicidal ideation.      Objective:  BP 140/82   Pulse 69   Temp 97.6 F (36.4 C) (Oral)   Resp 14   Wt 141 lb (64 kg)   SpO2 98%   BMI 25.79 kg/m   BP Readings from Last 3 Encounters:  04/01/16 140/82  02/09/16 130/80  12/26/14 118/74    Wt Readings from Last 3 Encounters:  04/01/16 141 lb (64 kg)  02/09/16 135 lb 2 oz (61.3 kg)  12/26/14 145 lb 4 oz (65.9 kg)    General appearance: alert, cooperative and appears stated age Ears: normal TM's and external ear canals both ears Throat: lips, mucosa, and tongue normal; teeth and gums normal Neck: no adenopathy, no carotid bruit, supple, symmetrical, trachea midline and thyroid not enlarged, symmetric, no tenderness/mass/nodules Back: symmetric, no curvature. ROM normal. No CVA tenderness. Lungs: clear to auscultation bilaterally Heart: regular rate and rhythm, S1,  S2 normal, no murmur, click, rub or gallop Abdomen: soft, non-tender; bowel sounds normal; no masses,  no organomegaly Pulses: 2+ and symmetric Skin: Skin color, texture, turgor normal. No rashes or lesions Lymph nodes: Cervical, supraclavicular, and axillary nodes normal.  Lab Results  Component Value Date   HGBA1C 5.4 12/22/2014    Lab Results  Component Value Date   CREATININE 0.77 02/09/2016   CREATININE 0.86 12/22/2014   CREATININE 0.8 10/25/2013    Lab Results  Component Value Date   WBC 6.0 12/22/2014   HGB 14.7 12/22/2014   HCT 43.5 12/22/2014   PLT 255.0 12/22/2014   GLUCOSE  103 (H) 02/09/2016   CHOL 243 (H) 02/09/2016   TRIG 110.0 02/09/2016   HDL 73.40 02/09/2016   LDLCALC 148 (H) 02/09/2016   ALT 13 02/09/2016   AST 26 02/09/2016   NA 141 02/09/2016   K 3.9 02/09/2016   CL 105 02/09/2016   CREATININE 0.77 02/09/2016   BUN 15 02/09/2016   CO2 30 02/09/2016   TSH 3.04 02/09/2016   HGBA1C 5.4 12/22/2014    Mm Screening Breast Tomo Bilateral  Result Date: 02/01/2015 CLINICAL DATA:  Screening. EXAM: DIGITAL SCREENING BILATERAL MAMMOGRAM WITH 3D TOMO WITH CAD COMPARISON:  Previous exam(s). ACR Breast Density Category b: There are scattered areas of fibroglandular density. FINDINGS: There are no findings suspicious for malignancy. Images were processed with CAD. IMPRESSION: No mammographic evidence of malignancy. A result letter of this screening mammogram will be mailed directly to the patient. RECOMMENDATION: Screening mammogram in one year. (Code:SM-B-01Y) BI-RADS CATEGORY  1: Negative. Electronically Signed   By: Annia Beltrew  Davis M.D.   On: 02/01/2015 09:19    Assessment & Plan:   Problem List Items Addressed This Visit    Anxiety state    Recommended trial of Lexapro. A total of 25 minutes was spent with patient,  more than half of which was spent in counseling patient on the above mentioned issues ,      Relevant Medications   escitalopram (LEXAPRO) 10 MG tablet   Insomnia secondary to anxiety    Adding lexapro.  rx for Ambien CR given.       Relevant Medications   escitalopram (LEXAPRO) 10 MG tablet    Other Visit Diagnoses   None.     I have discontinued [name]'s phentermine and zolpidem. I am also having her start on zolpidem and escitalopram. Additionally, I am having her maintain her multivitamin with minerals, cetirizine, and ALPRAZolam.  Meds ordered this encounter  Medications  . zolpidem (AMBIEN CR) 12.5 MG CR tablet    Sig: Take 1 tablet (12.5 mg total) by mouth at bedtime as needed for sleep.    Dispense:  30 tablet     Refill:  3  . escitalopram (LEXAPRO) 10 MG tablet    Sig: Take 1 tablet (10 mg total) by mouth daily.    Dispense:  30 tablet    Refill:  3    Medications Discontinued During This Encounter  Medication Reason  . phentermine (ADIPEX-P) 37.5 MG tablet   . zolpidem (AMBIEN CR) 6.25 MG CR tablet     Follow-up: No Follow-up on file.   Sherlene ShamsULLO, Daisey Caloca L, MD

## 2016-04-01 NOTE — Patient Instructions (Signed)
I recommend  starting lexapro daily to help manage your mood disorder  Please start the Lexapro (escitalopram) at 1/2 tablet daily in the evening for the first few days to avoid nausea.  You can increase to a full tablet after 4 days if you havenot developed side effects of nausea.  If the lexapro interferes with your sleep, take it in the morning instead  You may still use the alprazolam as needed for pnaic,  Rage,  Etc  I have refilled the Ambien CR at the higher dose .  Stat with 1/2 tablet

## 2016-04-02 NOTE — Assessment & Plan Note (Signed)
Adding lexapro.  rx for Ambien CR given.

## 2016-04-02 NOTE — Assessment & Plan Note (Addendum)
Recommended trial of Lexapro. A total of 25 minutes was spent with patient,  more than half of which was spent in counseling patient on the above mentioned issues ,

## 2016-04-08 ENCOUNTER — Telehealth: Payer: Self-pay | Admitting: *Deleted

## 2016-04-08 NOTE — Telephone Encounter (Signed)
Patient requested a call to consult on a over the counter medication that she can take for congestion, itchy eyes and cough. Pt contact [phone]

## 2016-04-11 ENCOUNTER — Ambulatory Visit
Admission: RE | Admit: 2016-04-11 | Discharge: 2016-04-11 | Disposition: A | Discharge status: Home / Self Care | Payer: BLUE CROSS/BLUE SHIELD | Source: Ambulatory Visit | Attending: Internal Medicine | Admitting: Internal Medicine

## 2016-04-11 DIAGNOSIS — Z1231 Encounter for screening mammogram for malignant neoplasm of breast: Secondary | ICD-10-CM | POA: Diagnosis present

## 2016-04-12 ENCOUNTER — Ambulatory Visit: Payer: BLUE CROSS/BLUE SHIELD

## 2016-07-14 ENCOUNTER — Other Ambulatory Visit: Payer: Self-pay | Admitting: Internal Medicine

## 2016-07-15 NOTE — Telephone Encounter (Signed)
Rx faxed to [facility] pharmacy.  

## 2016-07-15 NOTE — Telephone Encounter (Signed)
Rx Alprazolam 12.5 mg LOV 04/01/2016 LRF:04/01/2016 NOV: 02/12/2017 Please advise

## 2016-07-15 NOTE — Telephone Encounter (Signed)
In blue folder to be sign

## 2016-07-15 NOTE — Telephone Encounter (Signed)
REFILLED

## 2016-07-23 ENCOUNTER — Other Ambulatory Visit: Payer: Self-pay | Admitting: Internal Medicine

## 2016-08-13 ENCOUNTER — Other Ambulatory Visit: Payer: Self-pay | Admitting: Internal Medicine

## 2016-08-13 NOTE — Telephone Encounter (Signed)
Zolpidem LF: 04/01/16 # 30 w/ 3 Rf. LOV: 04/01/16 Next OV: 02/12/17  Ok to Rf?

## 2016-08-13 NOTE — Telephone Encounter (Signed)
REFILL AUTHORIZED  

## 2016-08-14 NOTE — Telephone Encounter (Signed)
Signed Rx faxed to [facility].  

## 2016-09-27 ENCOUNTER — Other Ambulatory Visit: Payer: Self-pay

## 2016-09-27 MED ORDER — ESCITALOPRAM OXALATE 10 MG PO TABS: 10.0000 mg | ORAL_TABLET | Freq: Every day | ORAL | 1 refills | Status: DC

## 2016-09-29 ENCOUNTER — Other Ambulatory Visit: Payer: Self-pay | Admitting: Internal Medicine

## 2016-10-01 NOTE — Telephone Encounter (Signed)
actually she has it bakcwards: I will refill the trazodone,  It is not a controlled substance and can be refilled without a visit,  The ambien cannot.

## 2016-10-01 NOTE — Telephone Encounter (Signed)
Refilled: discontinued on 08/2015 Last OV: 04/01/16 Last Labs: 02/09/16 Future OV: 02/12/17 Please advise?   Called pt to ask if she was taking medication. She stated that she was she had some left over. She stated that the Ambien that she was taking she did not feel was working. Pt is taking half a tablet of the trazodone. She says this helps her sleep. She was told you may want her to come in earlier than Sept. Pt stated that she could take the Ambien until her visit.

## 2016-12-13 ENCOUNTER — Other Ambulatory Visit: Payer: Self-pay

## 2016-12-17 ENCOUNTER — Other Ambulatory Visit: Payer: Self-pay

## 2016-12-17 MED ORDER — TRAZODONE HCL 50 MG PO TABS: ORAL_TABLET | ORAL | 2 refills | Status: DC

## 2017-01-07 ENCOUNTER — Telehealth: Payer: Self-pay | Admitting: Internal Medicine

## 2017-01-08 NOTE — Telephone Encounter (Signed)
Patient was last seen by you in 03/2016. Requesting refill.

## 2017-01-08 NOTE — Telephone Encounter (Signed)
Attempted to call patient. No voicemail setup. Will try again in the AM.

## 2017-01-08 NOTE — Telephone Encounter (Signed)
Please notify patient that the prescriptions were  Refilled for 30 days only because they are controlled substances and require a  6 month follow up and  it has been 10 months since her last visit. .  OFFICE VISIT NEEDED prior to any more refills  

## 2017-01-10 NOTE — Telephone Encounter (Signed)
Can you please schedule patient for a medication follow up next 30 days? Thanks

## 2017-01-13 NOTE — Telephone Encounter (Signed)
That is fine. I must've overlooked the appointment on 02/12/17 and she is booked through august.

## 2017-01-13 NOTE — Telephone Encounter (Signed)
Pt has her yearly physical scheduled for 02/12/17, will that be ok to be seen then for her medications?  Dr. Darrick Huntsmanullo really doesn't have any thing else available sooner.

## 2017-02-12 ENCOUNTER — Encounter: Payer: BLUE CROSS/BLUE SHIELD | Admitting: Internal Medicine

## 2017-04-02 ENCOUNTER — Other Ambulatory Visit (HOSPITAL_COMMUNITY)
Admission: RE | Admit: 2017-04-02 | Discharge: 2017-04-02 | Disposition: A | Discharge status: Home / Self Care | Source: Ambulatory Visit | Attending: Internal Medicine | Admitting: Internal Medicine

## 2017-04-02 ENCOUNTER — Encounter: Payer: Self-pay | Admitting: Internal Medicine

## 2017-04-02 ENCOUNTER — Ambulatory Visit (INDEPENDENT_AMBULATORY_CARE_PROVIDER_SITE_OTHER): Admitting: Internal Medicine

## 2017-04-02 VITALS — BP 130/78 | HR 55 | Temp 97.8°F | Resp 15 | Ht 62.0 in | Wt 141.0 lb

## 2017-04-02 DIAGNOSIS — Z124 Encounter for screening for malignant neoplasm of cervix: Secondary | ICD-10-CM | POA: Insufficient documentation

## 2017-04-02 DIAGNOSIS — E663 Overweight: Secondary | ICD-10-CM

## 2017-04-02 DIAGNOSIS — E559 Vitamin D deficiency, unspecified: Secondary | ICD-10-CM | POA: Diagnosis not present

## 2017-04-02 DIAGNOSIS — Z0000 Encounter for general adult medical examination without abnormal findings: Secondary | ICD-10-CM

## 2017-04-02 DIAGNOSIS — R5383 Other fatigue: Secondary | ICD-10-CM | POA: Insufficient documentation

## 2017-04-02 DIAGNOSIS — F419 Anxiety disorder, unspecified: Secondary | ICD-10-CM

## 2017-04-02 DIAGNOSIS — R635 Abnormal weight gain: Secondary | ICD-10-CM

## 2017-04-02 DIAGNOSIS — E7800 Pure hypercholesterolemia, unspecified: Secondary | ICD-10-CM | POA: Diagnosis not present

## 2017-04-02 DIAGNOSIS — F5105 Insomnia due to other mental disorder: Secondary | ICD-10-CM | POA: Diagnosis not present

## 2017-04-02 LAB — CBC WITH DIFFERENTIAL/PLATELET
BASOS ABS: 0.1 10*3/uL (ref 0.0–0.1)
Basophils Relative: 1.2 % (ref 0.0–3.0)
EOS ABS: 0.4 10*3/uL (ref 0.0–0.7)
EOS PCT: 6 % — AB (ref 0.0–5.0)
HEMATOCRIT: 44 % (ref 36.0–46.0)
Hemoglobin: 15 g/dL (ref 12.0–15.0)
LYMPHS ABS: 2.3 10*3/uL (ref 0.7–4.0)
LYMPHS PCT: 37.9 % (ref 12.0–46.0)
MCHC: 34 g/dL (ref 30.0–36.0)
MCV: 82.7 fl (ref 78.0–100.0)
MONOS PCT: 6 % (ref 3.0–12.0)
Monocytes Absolute: 0.4 10*3/uL (ref 0.1–1.0)
NEUTROS ABS: 3 10*3/uL (ref 1.4–7.7)
NEUTROS PCT: 48.9 % (ref 43.0–77.0)
PLATELETS: 272 10*3/uL (ref 150.0–400.0)
RBC: 5.32 Mil/uL — ABNORMAL HIGH (ref 3.87–5.11)
RDW: 12.8 % (ref 11.5–15.5)
WBC: 6.1 10*3/uL (ref 4.0–10.5)

## 2017-04-02 LAB — LIPID PANEL
Cholesterol: 251 mg/dL — ABNORMAL HIGH (ref 0–200)
HDL: 74.7 mg/dL (ref >39.00)
LDL Cholesterol: 153 mg/dL — ABNORMAL HIGH (ref 0–99)
NonHDL: 176.19
TRIGLYCERIDES: 117 mg/dL (ref 0.0–149.0)
Total CHOL/HDL Ratio: 3
VLDL: 23.4 mg/dL (ref 0.0–40.0)

## 2017-04-02 LAB — COMPREHENSIVE METABOLIC PANEL
ALBUMIN: 4.8 g/dL (ref 3.5–5.2)
ALT: 16 U/L (ref 0–35)
AST: 24 U/L (ref 0–37)
Alkaline Phosphatase: 73 U/L (ref 39–117)
BUN: 14 mg/dL (ref 6–23)
CHLORIDE: 104 meq/L (ref 96–112)
CO2: 31 mEq/L (ref 19–32)
CREATININE: 0.79 mg/dL (ref 0.40–1.20)
Calcium: 10.1 mg/dL (ref 8.4–10.5)
GFR: 78.94 mL/min (ref >60.00)
Glucose, Bld: 102 mg/dL — ABNORMAL HIGH (ref 70–99)
Potassium: 4.2 mEq/L (ref 3.5–5.1)
SODIUM: 141 meq/L (ref 135–145)
Total Bilirubin: 0.8 mg/dL (ref 0.2–1.2)
Total Protein: 7.3 g/dL (ref 6.0–8.3)

## 2017-04-02 LAB — TSH: TSH: 2.66 u[IU]/mL (ref 0.35–4.50)

## 2017-04-02 LAB — VITAMIN D 25 HYDROXY (VIT D DEFICIENCY, FRACTURES): VITD: 30.77 ng/mL (ref 30.00–100.00)

## 2017-04-02 MED ORDER — NALTREXONE HCL 50 MG PO TABS: 25.0000 mg | ORAL_TABLET | Freq: Every day | ORAL | 2 refills | Status: DC

## 2017-04-02 MED ORDER — NALTREXONE HCL 50 MG PO TABS: 25.0000 mg | ORAL_TABLET | Freq: Two times a day (BID) | ORAL | 2 refills | Status: DC | PRN

## 2017-04-02 MED ORDER — ALPRAZOLAM 0.5 MG PO TABS
0.5000 mg | ORAL_TABLET | Freq: Every day | ORAL | 5 refills | Status: DC | PRN
Start: 2017-04-02 — End: 2017-04-02

## 2017-04-02 MED ORDER — ALPRAZOLAM 0.5 MG PO TABS: 0.5000 mg | ORAL_TABLET | Freq: Every day | ORAL | 5 refills | Status: DC | PRN

## 2017-04-02 NOTE — Progress Notes (Signed)
Patient ID: Michelle Mahoney, female    DOB: 21-Aug-1955  Age: 61 y.o. MRN: [id number]  The patient is here for annual Medicare wellness examination and management of other chronic and acute problems.    S/p supracervical hysterectomy at age 71 colonoscopy 2015 due 2025 Mammogram  No 2017   The risk factors are reflected in the social history.  The roster of all physicians providing medical care to patient - is listed in the Snapshot section of the chart.  Activities of daily living:  The patient is 100% independent in all ADLs: dressing, toileting, feeding as well as independent mobility  Home safety : The patient has smoke detectors in the home. They wear seatbelts.  There are no firearms at home. There is no violence in the home.   There is no risks for hepatitis, STDs or HIV. There is no   history of blood transfusion. They have no travel history to infectious disease endemic areas of the world.  The patient has seen their dentist in the last six month. They have seen their eye doctor in the last year. T.  They do not  have excessive sun exposure. Discussed the need for sun protection: hats, long sleeves and use of sunscreen if there is significant sun exposure.   Diet: the importance of a healthy diet is discussed. They do have a healthy diet.  The benefits of regular aerobic exercise were discussed. She works out 5 days per week,  60 minutes  Depression screen: there are no signs or vegative symptoms of depression- irritability, change in appetite, anhedonia, sadness/tearfullness.   The following portions of the patient's history were reviewed and updated as appropriate: allergies, current medications, past family history, past medical history,  past surgical history, past social history  and problem list.  Visual acuity was not assessed per patient preference since she has regular follow up with her ophthalmologist. Hearing and body mass index were assessed and reviewed.   During  the course of the visit the patient was educated and counseled about appropriate screening and preventive services including : fall prevention , diabetes screening, nutrition counseling, colorectal cancer screening, and recommended immunizations.    CC: The primary encounter diagnosis was Weight gain. Diagnoses of Vitamin D deficiency, Fatigue, unspecified type, Cervical cancer screening, Routine general medical examination at a health care facility, Insomnia secondary to anxiety, Overweight (BMI 25.0-29.9), and Pure hypercholesterolemia were also pertinent to this visit.  trouble losing weight.  lost significant weight last year on a calorie restricted physician supervised diet but could not maintain the desired weight.  Still exercising vigorously . Wants phentermine  History Michelle Mahoney has a past medical history of Chicken pox.   She has a past surgical history that includes Abdominal hysterectomy (2001) and Lesion excision (2008).   Her family history includes Cancer (age of onset: 45) in her mother; Heart disease (age of onset: 16) in her father; Mental illness (age of onset: 76) in her father; Stroke (age of onset: 78) in her mother.She reports that she has never smoked. She has never used smokeless tobacco. She reports that she drinks alcohol. She reports that she does not use drugs.  Outpatient Medications Prior to Visit  Medication Sig Dispense Refill  . cetirizine (ZYRTEC) 10 MG tablet Take 10 mg by mouth as needed.     Marland Kitchen escitalopram (LEXAPRO) 10 MG tablet Take 1 tablet (10 mg total) by mouth daily. 90 tablet 1  . Multiple Vitamins-Minerals (MULTIVITAMIN WITH MINERALS) tablet Take 1  tablet by mouth daily.    . traZODone (DESYREL) 50 MG tablet take 1/2-1 tablet by mouth at bedtime if needed for sleep 30 tablet 2  . ALPRAZolam (XANAX) 0.5 MG tablet TAKE 1 TABLET BY MOUTH DAILY AS NEEDED FOR ANXIETY 30 tablet 0  . zolpidem (AMBIEN CR) 12.5 MG CR tablet TAKE ONE TABLET BY MOUTH EVERY NIGHT AT  BEDTIME AS NEEDED (Patient not taking: Reported on 04/02/2017) 30 tablet 2   No facility-administered medications prior to visit.     Review of Systems   Patient denies headache, fevers, malaise, unintentional weight loss, skin rash, eye pain, sinus congestion and sinus pain, sore throat, dysphagia,  hemoptysis , cough, dyspnea, wheezing, chest pain, palpitations, orthopnea, edema, abdominal pain, nausea, melena, diarrhea, constipation, flank pain, dysuria, hematuria, urinary  Frequency, nocturia, numbness, tingling, seizures,  Focal weakness, Loss of consciousness,  Tremor, insomnia, depression, anxiety, and suicidal ideation.     Objective:  BP 130/78 (BP Location: Left Arm, Patient Position: Sitting, Cuff Size: Normal)   Pulse (!) 55   Temp 97.8 F (36.6 C) (Oral)   Resp 15   Ht 5\' 2"  (1.575 m)   Wt 141 lb (64 kg)   SpO2 96%   BMI 25.79 kg/m   Physical Exam  General Appearance:    Alert, cooperative, no distress, appears stated age  Head:    Normocephalic, without obvious abnormality, atraumatic  Eyes:    PERRL, conjunctiva/corneas clear, EOM's intact, fundi    benign, both eyes  Ears:    Normal TM's and external ear canals, both ears  Nose:   Nares normal, septum midline, mucosa normal, no drainage    or sinus tenderness  Throat:   Lips, mucosa, and tongue normal; teeth and gums normal  Neck:   Supple, symmetrical, trachea midline, no adenopathy;    thyroid:  no enlargement/tenderness/nodules; no carotid   bruit or JVD  Back:     Symmetric, no curvature, ROM normal, no CVA tenderness  Lungs:     Clear to auscultation bilaterally, respirations unlabored  Chest Wall:    No tenderness or deformity   Heart:    Regular rate and rhythm, S1 and S2 normal, no murmur, rub   or gallop  Breast Exam:    No tenderness, masses, or nipple abnormality  Abdomen:     Soft, non-tender, bowel sounds active all four quadrants,    no masses, no organomegaly  Genitalia:    Pelvic: cervix  normal in appearance, external genitalia normal, no adnexal masses or tenderness, no cervical motion tenderness, rectovaginal septum normal, uterus normal size, shape, and consistency and vagina normal without discharge  Extremities:   Extremities normal, atraumatic, no cyanosis or edema  Pulses:   2+ and symmetric all extremities  Skin:   Skin color, texture, turgor normal, no rashes or lesions  Lymph nodes:   Cervical, supraclavicular, and axillary nodes normal  Neurologic:   CNII-XII intact, normal strength, sensation and reflexes    throughout     Assessment & Plan:   Problem List Items Addressed This Visit    Hyperlipidemia    Mild, 10 yr risk is 7% /. No therapy advised  Lab Results  Component Value Date   CHOL 251 (H) 04/02/2017   HDL 74.70 04/02/2017   LDLCALC 153 (H) 04/02/2017   TRIG 117.0 04/02/2017   CHOLHDL 3 04/02/2017         Insomnia secondary to anxiety    lexapro  , and trazodone for insomnia .  Prn alprazolam       Relevant Medications   ALPRAZolam (XANAX) 0.5 MG tablet   Overweight (BMI 25.0-29.9)    Minimally.  Request for phentermine denied      Routine general medical examination at a health care facility    Annual comprehensive preventive exam was done as well as an evaluation and management of chronic conditions .  During the course of the visit the patient was educated and counseled about appropriate screening and preventive services including :  diabetes screening, lipid analysis with projected  10 year  risk for CAD , nutrition counseling, breast, cervical and colorectal cancer screening, and recommended immunizations.  Printed recommendations for health maintenance screenings was given.  PAP smear normal except for atrophy.        Other Visit Diagnoses    Weight gain    -  Primary   Relevant Orders   Lipid panel (Completed)   Vitamin D deficiency       Relevant Orders   VITAMIN D 25 Hydroxy (Vit-D Deficiency, Fractures) (Completed)    Fatigue, unspecified type       Relevant Orders   Comprehensive metabolic panel (Completed)   TSH (Completed)   CBC with Differential/Platelet (Completed)   Cervical cancer screening       Relevant Orders   Cytology - PAP (Completed)      I have discontinued Ms. Tech's zolpidem and naltrexone. I am also having her start on naltrexone. Additionally, I am having her maintain her multivitamin with minerals, cetirizine, escitalopram, traZODone, and ALPRAZolam.  Meds ordered this encounter  Medications  . DISCONTD: ALPRAZolam (XANAX) 0.5 MG tablet    Sig: Take 1 tablet (0.5 mg total) by mouth daily as needed. for anxiety    Dispense:  30 tablet    Refill:  5    Refill for 30 days only.  OFFICE VISIT NEEDED prior to any more refills  . DISCONTD: naltrexone (DEPADE) 50 MG tablet    Sig: Take 0.5 tablets (25 mg total) by mouth 2 (two) times daily between meals as needed.    Dispense:  30 tablet    Refill:  2  . naltrexone (DEPADE) 50 MG tablet    Sig: Take 0.5 tablets (25 mg total) by mouth daily.    Dispense:  30 tablet    Refill:  2  . ALPRAZolam (XANAX) 0.5 MG tablet    Sig: Take 1 tablet (0.5 mg total) by mouth daily as needed. for anxiety    Dispense:  30 tablet    Refill:  5    Medications Discontinued During This Encounter  Medication Reason  . zolpidem (AMBIEN CR) 12.5 MG CR tablet Patient has not taken in last 30 days  . ALPRAZolam (XANAX) 0.5 MG tablet Reorder  . naltrexone (DEPADE) 50 MG tablet   . ALPRAZolam (XANAX) 0.5 MG tablet Reorder    Follow-up: No Follow-up on file.   Sherlene Shams, MD

## 2017-04-02 NOTE — Patient Instructions (Addendum)
The naltrexone may make you nauseated.  It is part of the combination weight loss medication called Contrave   Fainting has also been reported   Do not take more than 1/2 tablet daily   Naltrexone tablets What is this medicine? NALTREXONE (nal TREX one) helps you to remain free of your dependence on opiate drugs or alcohol. It blocks the 'high' that these substances can give you. This medicine is combined with counseling and support groups. This medicine may be used for other purposes; ask your health care provider or pharmacist if you have questions. COMMON BRAND NAME(S): Depade, ReVia What should I tell my health care provider before I take this medicine? They need to know if you have any of these conditions: -if you have used drugs or alcohol within 7 to 10 days -kidney disease -liver disease, including hepatitis -an unusual or allergic reaction to naltrexone, other medicines, foods, dyes, or preservatives -pregnant or trying to get pregnant -breast-feeding How should I use this medicine? Take this medicine by mouth with a full glass of water. Follow the directions on the prescription label. Do not take this medicine within 7 to 10 days of taking any opioid drugs. Take your medicine at regular intervals. Do not take your medicine more often than directed. Do not stop taking except on your doctor's advice. Talk to your pediatrician regarding the use of this medicine in children. Special care may be needed. Overdosage: If you think you have taken too much of this medicine contact a poison control center or emergency room at once. NOTE: This medicine is only for you. Do not share this medicine with others. What if I miss a dose? If you miss a dose and remember on the same day, take the missed dose. If you do not remember until the next day, ask your doctor or health care professional about rescheduling your doses. Do not take double or extra doses. What may interact with this medicine? Do  not take this medicine with any of the following medications: -any prescription or street opioid drug like codiene, heroin, methadone This medicine may also interact with the following medications: -disulfiram -thioridazine This list may not describe all possible interactions. Give your health care provider a list of all the medicines, herbs, non-prescription drugs, or dietary supplements you use. Also tell them if you smoke, drink alcohol, or use illegal drugs. Some items may interact with your medicine. What should I watch for while using this medicine? Your condition will be monitored carefully while you are receiving this medicine. Visit your doctor or health care professional regularly. For this medicine to be most effective you should attend any counseling or support groups that your doctor or health care professional recommends. Do not try to overcome the effects of the medicine by taking large amounts of narcotics or by drinking large amounts of alcohol. This can cause severe problems including death. Also, you may be more sensitive to lower doses of narcotics after you stop taking this medicine. If you are going to have surgery, tell your doctor or health care professional that you are taking this medicine. Do not treat yourself for coughs, colds, pain, or diarrhea. Ask your doctor or health care professional for advice. Some of the ingredients may interact with this medicine and cause side effects. Wear a medical ID bracelet or chain, and carry a card that describes your disease and details of your medicine and dosage times. You may get drowsy or dizzy. Do not drive, use machinery, or  do anything that needs mental alertness until you know how this medicine affects you. Do not stand or sit up quickly, especially if you are an older patient. This reduces the risk of dizzy or fainting spells. Alcohol may interfere with the effect of this medicine. Avoid alcoholic drinks. What side effects may I  notice from receiving this medicine? Side effects that you should report to your doctor or health care professional as soon as possible: -allergic reactions like skin rash, itching or hives, swelling of the face, lips, or tongue -breathing problems -changes in vision, hearing -confusion -dark urine -depressed mood -diarrhea -fast or irregular heart beat -hallucination, loss of contact with reality -light-colored stools -right upper belly pain -suicidal thoughts or other mood changes -unusually weak or tired -vomiting -yellowing of the eyes or skin Side effects that usually do not require medical attention (report to your doctor or health care professional if they continue or are bothersome): -aches, pains -change in sex drive or performance -feeling anxious -headache -loss of appetite, nausea -runny nose, sinus problems, sneezing -stomach pain -trouble sleeping This list may not describe all possible side effects. Call your doctor for medical advice about side effects. You may report side effects to FDA at 1-800-FDA-1088. Where should I keep my medicine? Keep out of the reach of children. Store at room temperature between 20 and 25 degrees C (68 and 77 degrees F). Throw away any unused medicine after the expiration date. NOTE: This sheet is a summary. It may not cover all possible information. If you have questions about this medicine, talk to your doctor, pharmacist, or health care provider.  2018 Elsevier/Gold Standard (2012-03-19 10:33:18)    Health Maintenance for Postmenopausal Women Menopause is a normal process in which your reproductive ability comes to an end. This process happens gradually over a span of months to years, usually between the ages of 60 and 79. Menopause is complete when you have missed 12 consecutive menstrual periods. It is important to talk with your health care provider about some of the most common conditions that affect postmenopausal women, such  as heart disease, cancer, and bone loss (osteoporosis). Adopting a healthy lifestyle and getting preventive care can help to promote your health and wellness. Those actions can also lower your chances of developing some of these common conditions. What should I know about menopause? During menopause, you may experience a number of symptoms, such as:  Moderate-to-severe hot flashes.  Night sweats.  Decrease in sex drive.  Mood swings.  Headaches.  Tiredness.  Irritability.  Memory problems.  Insomnia.  Choosing to treat or not to treat menopausal changes is an individual decision that you make with your health care provider. What should I know about hormone replacement therapy and supplements? Hormone therapy products are effective for treating symptoms that are associated with menopause, such as hot flashes and night sweats. Hormone replacement carries certain risks, especially as you become older. If you are thinking about using estrogen or estrogen with progestin treatments, discuss the benefits and risks with your health care provider. What should I know about heart disease and stroke? Heart disease, heart attack, and stroke become more likely as you age. This may be due, in part, to the hormonal changes that your body experiences during menopause. These can affect how your body processes dietary fats, triglycerides, and cholesterol. Heart attack and stroke are both medical emergencies. There are many things that you can do to help prevent heart disease and stroke:  Have your blood  pressure checked at least every 1-2 years. High blood pressure causes heart disease and increases the risk of stroke.  If you are 42-83 years old, ask your health care provider if you should take aspirin to prevent a heart attack or a stroke.  Do not use any tobacco products, including cigarettes, chewing tobacco, or electronic cigarettes. If you need help quitting, ask your health care provider.  It  is important to eat a healthy diet and maintain a healthy weight. ? Be sure to include plenty of vegetables, fruits, low-fat dairy products, and lean protein. ? Avoid eating foods that are high in solid fats, added sugars, or salt (sodium).  Get regular exercise. This is one of the most important things that you can do for your health. ? Try to exercise for at least 150 minutes each week. The type of exercise that you do should increase your heart rate and make you sweat. This is known as moderate-intensity exercise. ? Try to do strengthening exercises at least twice each week. Do these in addition to the moderate-intensity exercise.  Know your numbers.Ask your health care provider to check your cholesterol and your blood glucose. Continue to have your blood tested as directed by your health care provider.  What should I know about cancer screening? There are several types of cancer. Take the following steps to reduce your risk and to catch any cancer development as early as possible. Breast Cancer  Practice breast self-awareness. ? This means understanding how your breasts normally appear and feel. ? It also means doing regular breast self-exams. Let your health care provider know about any changes, no matter how small.  If you are 77 or older, have a clinician do a breast exam (clinical breast exam or CBE) every year. Depending on your age, family history, and medical history, it may be recommended that you also have a yearly breast X-ray (mammogram).  If you have a family history of breast cancer, talk with your health care provider about genetic screening.  If you are at high risk for breast cancer, talk with your health care provider about having an MRI and a mammogram every year.  Breast cancer (BRCA) gene test is recommended for women who have family members with BRCA-related cancers. Results of the assessment will determine the need for genetic counseling and BRCA1 and for BRCA2  testing. BRCA-related cancers include these types: ? Breast. This occurs in males or females. ? Ovarian. ? Tubal. This may also be called fallopian tube cancer. ? Cancer of the abdominal or pelvic lining (peritoneal cancer). ? Prostate. ? Pancreatic.  Cervical, Uterine, and Ovarian Cancer Your health care provider may recommend that you be screened regularly for cancer of the pelvic organs. These include your ovaries, uterus, and vagina. This screening involves a pelvic exam, which includes checking for microscopic changes to the surface of your cervix (Pap test).  For women ages 21-65, health care providers may recommend a pelvic exam and a Pap test every three years. For women ages 6-65, they may recommend the Pap test and pelvic exam, combined with testing for human papilloma virus (HPV), every five years. Some types of HPV increase your risk of cervical cancer. Testing for HPV may also be done on women of any age who have unclear Pap test results.  Other health care providers may not recommend any screening for nonpregnant women who are considered low risk for pelvic cancer and have no symptoms. Ask your health care provider if a screening pelvic  exam is right for you.  If you have had past treatment for cervical cancer or a condition that could lead to cancer, you need Pap tests and screening for cancer for at least 20 years after your treatment. If Pap tests have been discontinued for you, your risk factors (such as having a new sexual partner) need to be reassessed to determine if you should start having screenings again. Some women have medical problems that increase the chance of getting cervical cancer. In these cases, your health care provider may recommend that you have screening and Pap tests more often.  If you have a family history of uterine cancer or ovarian cancer, talk with your health care provider about genetic screening.  If you have vaginal bleeding after reaching  menopause, tell your health care provider.  There are currently no reliable tests available to screen for ovarian cancer.  Lung Cancer Lung cancer screening is recommended for adults 23-80 years old who are at high risk for lung cancer because of a history of smoking. A yearly low-dose CT scan of the lungs is recommended if you:  Currently smoke.  Have a history of at least 30 pack-years of smoking and you currently smoke or have quit within the past 15 years. A pack-year is smoking an average of one pack of cigarettes per day for one year.  Yearly screening should:  Continue until it has been 15 years since you quit.  Stop if you develop a health problem that would prevent you from having lung cancer treatment.  Colorectal Cancer  This type of cancer can be detected and can often be prevented.  Routine colorectal cancer screening usually begins at age 55 and continues through age 40.  If you have risk factors for colon cancer, your health care provider may recommend that you be screened at an earlier age.  If you have a family history of colorectal cancer, talk with your health care provider about genetic screening.  Your health care provider may also recommend using home test kits to check for hidden blood in your stool.  A small camera at the end of a tube can be used to examine your colon directly (sigmoidoscopy or colonoscopy). This is done to check for the earliest forms of colorectal cancer.  Direct examination of the colon should be repeated every 5-10 years until age 94. However, if early forms of precancerous polyps or small growths are found or if you have a family history or genetic risk for colorectal cancer, you may need to be screened more often.  Skin Cancer  Check your skin from head to toe regularly.  Monitor any moles. Be sure to tell your health care provider: ? About any new moles or changes in moles, especially if there is a change in a mole's shape or  color. ? If you have a mole that is larger than the size of a pencil eraser.  If any of your family members has a history of skin cancer, especially at a young age, talk with your health care provider about genetic screening.  Always use sunscreen. Apply sunscreen liberally and repeatedly throughout the day.  Whenever you are outside, protect yourself by wearing long sleeves, pants, a wide-brimmed hat, and sunglasses.  What should I know about osteoporosis? Osteoporosis is a condition in which bone destruction happens more quickly than new bone creation. After menopause, you may be at an increased risk for osteoporosis. To help prevent osteoporosis or the bone fractures that can happen  because of osteoporosis, the following is recommended:  If you are 46-54 years old, get at least 1,000 mg of calcium and at least 600 mg of vitamin D per day.  If you are older than age 27 but younger than age 63, get at least 1,200 mg of calcium and at least 600 mg of vitamin D per day.  If you are older than age 20, get at least 1,200 mg of calcium and at least 800 mg of vitamin D per day.  Smoking and excessive alcohol intake increase the risk of osteoporosis. Eat foods that are rich in calcium and vitamin D, and do weight-bearing exercises several times each week as directed by your health care provider. What should I know about how menopause affects my mental health? Depression may occur at any age, but it is more common as you become older. Common symptoms of depression include:  Low or sad mood.  Changes in sleep patterns.  Changes in appetite or eating patterns.  Feeling an overall lack of motivation or enjoyment of activities that you previously enjoyed.  Frequent crying spells.  Talk with your health care provider if you think that you are experiencing depression. What should I know about immunizations? It is important that you get and maintain your immunizations. These include:  Tetanus,  diphtheria, and pertussis (Tdap) booster vaccine.  Influenza every year before the flu season begins.  Pneumonia vaccine.  Shingles vaccine.  Your health care provider may also recommend other immunizations. This information is not intended to replace advice given to you by your health care provider. Make sure you discuss any questions you have with your health care provider. Document Released: 07/19/2005 Document Revised: 12/15/2015 Document Reviewed: 02/28/2015 Elsevier Interactive Patient Education  2018 [employer].

## 2017-04-04 ENCOUNTER — Encounter: Payer: Self-pay | Admitting: Internal Medicine

## 2017-04-04 DIAGNOSIS — E785 Hyperlipidemia, unspecified: Secondary | ICD-10-CM | POA: Insufficient documentation

## 2017-04-04 LAB — CYTOLOGY - PAP
Diagnosis: NEGATIVE
HPV: NOT DETECTED

## 2017-04-04 NOTE — Assessment & Plan Note (Signed)
Mild, 10 yr risk is 7% /. No therapy advised  Lab Results  Component Value Date   CHOL 251 (H) 04/02/2017   HDL 74.70 04/02/2017   LDLCALC 153 (H) 04/02/2017   TRIG 117.0 04/02/2017   CHOLHDL 3 04/02/2017

## 2017-04-04 NOTE — Assessment & Plan Note (Signed)
lexapro  , and trazodone for insomnia . Prn alprazolam

## 2017-04-04 NOTE — Assessment & Plan Note (Signed)
Minimally.  Request for phentermine denied

## 2017-04-04 NOTE — Assessment & Plan Note (Addendum)
Annual comprehensive preventive exam was done as well as an evaluation and management of chronic conditions .  During the course of the visit the patient was educated and counseled about appropriate screening and preventive services including :  diabetes screening, lipid analysis with projected  10 year  risk for CAD , nutrition counseling, breast, cervical and colorectal cancer screening, and recommended immunizations.  Printed recommendations for health maintenance screenings was given.  PAP smear normal except for atrophy.

## 2017-04-14 ENCOUNTER — Telehealth: Payer: Self-pay | Admitting: Internal Medicine

## 2017-04-14 NOTE — Telephone Encounter (Signed)
Patient notified of lab results and mailed copy.

## 2017-04-14 NOTE — Telephone Encounter (Signed)
Please advise 

## 2017-04-14 NOTE — Telephone Encounter (Signed)
Copied from CRM #[id number]. Topic: Quick Communication - See Telephone Encounter >> Apr 14, 2017 10:11 AM Michelle Mahoney, Michelle Mahoney wrote: CRM for notification. See Telephone encounter for: pt called about labs from [facility] on 10/24.Marland Kitchen. Please contact to review results and mail copy for her records.  04/14/17.

## 2017-04-28 ENCOUNTER — Other Ambulatory Visit: Payer: Self-pay | Admitting: Internal Medicine

## 2017-05-14 ENCOUNTER — Other Ambulatory Visit: Payer: Self-pay | Admitting: Internal Medicine

## 2017-05-23 ENCOUNTER — Other Ambulatory Visit: Payer: Self-pay | Admitting: Internal Medicine

## 2017-05-23 DIAGNOSIS — Z1231 Encounter for screening mammogram for malignant neoplasm of breast: Secondary | ICD-10-CM

## 2017-06-18 ENCOUNTER — Ambulatory Visit
Admission: RE | Admit: 2017-06-18 | Discharge: 2017-06-18 | Disposition: A | Discharge status: Home / Self Care | Source: Ambulatory Visit | Attending: Internal Medicine | Admitting: Internal Medicine

## 2017-06-18 DIAGNOSIS — Z1231 Encounter for screening mammogram for malignant neoplasm of breast: Secondary | ICD-10-CM | POA: Diagnosis present

## 2017-07-25 ENCOUNTER — Telehealth: Payer: Self-pay | Admitting: *Deleted

## 2017-07-25 ENCOUNTER — Encounter: Payer: Self-pay | Admitting: Internal Medicine

## 2017-07-25 ENCOUNTER — Ambulatory Visit (INDEPENDENT_AMBULATORY_CARE_PROVIDER_SITE_OTHER): Admitting: Internal Medicine

## 2017-07-25 VITALS — BP 140/70 | HR 53 | Temp 97.9°F | Ht 62.0 in | Wt 145.2 lb

## 2017-07-25 DIAGNOSIS — R51 Headache: Secondary | ICD-10-CM | POA: Diagnosis not present

## 2017-07-25 DIAGNOSIS — R001 Bradycardia, unspecified: Secondary | ICD-10-CM

## 2017-07-25 DIAGNOSIS — J329 Chronic sinusitis, unspecified: Secondary | ICD-10-CM | POA: Diagnosis not present

## 2017-07-25 DIAGNOSIS — R519 Headache, unspecified: Secondary | ICD-10-CM

## 2017-07-25 DIAGNOSIS — R42 Dizziness and giddiness: Secondary | ICD-10-CM | POA: Diagnosis not present

## 2017-07-25 DIAGNOSIS — G4700 Insomnia, unspecified: Secondary | ICD-10-CM | POA: Diagnosis not present

## 2017-07-25 MED ORDER — SALINE SPRAY 0.65 % NA SOLN: NASAL | 2 refills | Status: DC

## 2017-07-25 MED ORDER — FLUTICASONE PROPIONATE 50 MCG/ACT NA SUSP: NASAL | 0 refills | Status: DC

## 2017-07-25 MED ORDER — AZITHROMYCIN 250 MG PO TABS: ORAL_TABLET | ORAL | 0 refills | Status: DC

## 2017-07-25 NOTE — Telephone Encounter (Signed)
Copied from CRM [id number]. Topic: Inquiry >> Jul 25, 2017  9:48 AM Crist InfanteHarrald, Angello Chien J wrote: Reason for CRM: pt called to report she is dizzy, has been for 2 days.  Had to go back to bed this am because she was so dizzy. Asked to see Dr Darrick Huntsmanullo. Pt does want to be seen. Dr Kennith Centerracey had an appt, scheduled pt at 11:30 today. Pt wanted Dr Darrick Huntsmanullo to know in case she may make an exception and see the pt (pt says if you will tell her it is me)

## 2017-07-25 NOTE — Progress Notes (Signed)
Pre visit review using our clinic review tool, if applicable. No additional management support is needed unless otherwise documented below in the visit note. 

## 2017-07-25 NOTE — Progress Notes (Signed)
Chief Complaint  Patient presents with  . Dizziness  . Sinus Problem    congestion x 1 month    Pt reports  1. Dizziness x 2 days w/o focal weakness. She noted dizziness x few seconds with getting out of bed so she went to lie down dizziness has been x 2 days and she feels like she is unable to walk straight if she had to walk. No n/v/vertigo sx's. BP normal today. She reports she is drinking enough water.  Orthostatics checked today neg 150/78 HR 52, sitting 142/78 HR 53, standing 136/88 HR 63 negative.   2. She reports sinus congestion x 1 month w/o cough and she has h/o allergies to pollen not really taking Zyrtec but her throat and ears are itching.   3 h/a today occipital region 4-5/10 nothing tried yet normal will try Aleve or Tylenol. She does report only sleeping 30 minutes last night and she has tried to take 50 mg trazadone for sleep w/o relief instead of 25 mg. Previously she thinks she tried melatonin w/o relief. She reports only doing 1 cup of caffeine qd.  4. Of note she has not started any new medications and never started Naltrexone for appetite suppressant.    Review of Systems  Constitutional: Negative for weight loss.  HENT: Positive for congestion. Negative for hearing loss.        +ears itching   Eyes: Negative for blurred vision.  Respiratory: Negative for cough.   Cardiovascular: Negative for chest pain.  Musculoskeletal: Negative for falls.  Skin: Negative for rash.  Neurological: Positive for dizziness and headaches. Negative for focal weakness and loss of consciousness.  Psychiatric/Behavioral: The patient has insomnia.    Past Medical History:  Diagnosis Date  . Allergy    pollen   . Chicken pox   . Sinus bradycardia    Past Surgical History:  Procedure Laterality Date  . ABDOMINAL HYSTERECTOMY  2001  . LESION EXCISION  2008   anal area   Family History  Problem Relation Age of Onset  . Cancer Mother 40       melanoma  . Stroke Mother [id number]   secondary to septic emboli spine infection  . Heart disease Father [id number]       Heart failure  . Mental illness Father [id number]       dementia  . Breast cancer Neg Hx    Social History   Socioeconomic History  . Marital status: Married    Spouse name: Cherlynn KaiserRoger Setzer  . Number of children: Not on file  . Years of education: Not on file  . Highest education level: Not on file  Social Needs  . Financial resource strain: Not on file  . Food insecurity - worry: Not on file  . Food insecurity - inability: Not on file  . Transportation needs - medical: Not on file  . Transportation needs - non-medical: Not on file  Occupational History    Employer: [employer]  Tobacco Use  . Smoking status: Never Smoker  . Smokeless tobacco: Never Used  Substance and Sexual Activity  . Alcohol use: Yes    Comment: rarely  . Drug use: No  . Sexual activity: Not Currently  Other Topics Concern  . Not on file  Social History Narrative   Works as a [employer] for [employer]   Current Meds  Medication Sig  . ALPRAZolam (XANAX) 0.5 MG tablet Take 1 tablet (0.5 mg total) by mouth daily  as needed. for anxiety  . cetirizine (ZYRTEC) 10 MG tablet Take 10 mg by mouth as needed.   Marland Kitchen escitalopram (LEXAPRO) 10 MG tablet TAKE 1 TABLET (10 MG TOTAL) BY MOUTH DAILY.  . Multiple Vitamins-Minerals (MULTIVITAMIN WITH MINERALS) tablet Take 1 tablet by mouth daily.  . naltrexone (DEPADE) 50 MG tablet Take 0.5 tablets (25 mg total) by mouth daily.  . traZODone (DESYREL) 50 MG tablet take 1/2-1 tablet by mouth at bedtime if needed for sleep   No Known Allergies No results found for this or any previous visit (from the past 2160 hour(s)). Objective  Body mass index is 26.56 kg/m. Wt Readings from Last 3 Encounters:  07/25/17 145 lb 3.2 oz (65.9 kg)  04/02/17 141 lb (64 kg)  04/01/16 141 lb (64 kg)   Temp Readings from Last 3 Encounters:  07/25/17 97.9 F (36.6 C) (Oral)  04/02/17 97.8 F  (36.6 C) (Oral)  04/01/16 97.6 F (36.4 C) (Oral)   BP Readings from Last 3 Encounters:  07/25/17 140/70  04/02/17 130/78  04/01/16 140/82   Pulse Readings from Last 3 Encounters:  07/25/17 (!) 53  04/02/17 (!) 55  04/01/16 69   O2 sat room air 96%  Physical Exam  Constitutional: She is oriented to person, place, and time and well-developed, well-nourished, and in no distress.  HENT:  Head: Normocephalic and atraumatic.  Mouth/Throat: Oropharynx is clear and moist and mucous membranes are normal.  Mild fluid in right ear   Eyes: Conjunctivae are normal. Pupils are equal, round, and reactive to light.  Cardiovascular: Regular rhythm and normal heart sounds. Bradycardia present.  Pulmonary/Chest: Effort normal and breath sounds normal.  Neurological: She is alert and oriented to person, place, and time. Gait normal. Gait normal.  CN 2-12 grossly intact  Nl motor strength   Skin: Skin is warm and dry.  Psychiatric: Mood, memory, affect and judgment normal.  Nursing note and vitals reviewed.   Assessment   1. Dizziness could be related to ongoing sinus issue x 1 month, other ddx  R/o brain etiology vs dizziness related to bradycardia vs h/o anxiety  Less likely vertigo, orthostatics neg. BP normal today and prev. Normal.   2. Occipital h/a  3. Insomnia  4. Sinus Bradycardia  5. HM Plan  1.  Trial of NS, Flonase, zyrtec qhs, Zpack  If sxs do not resolve call back next week and consider other ddx  Slow head movements  2. Prn NSAID, tylenol  Needs more sleep will try Trazadone 50 mg qhs, sleep hygiene disc  3 See #2 disc with PCP in future increasing trazadone vs other tx options sleep tried melatonin in the past w/o relif  4.  Consider further w/u if continues and also has dizziness I.e cardiology consult TSH nl 03/2017 5.  F/u other HM with PCP  Provider: Dr. French Ana [facility]

## 2017-07-25 NOTE — Telephone Encounter (Signed)
FYI

## 2017-07-25 NOTE — Telephone Encounter (Signed)
FYI   Thanks tMS

## 2017-07-25 NOTE — Patient Instructions (Addendum)
Try Zyrtec at night  Try Nasal saline then Flonase max 2 sprays each nose 1x per day  Try Zpack 2 pills day 1 and 1 pill day 2-5 with food  Call back in 1 week if not better  Tylenol or Ibuprofen for headache as needed  Dizziness Dizziness is a common problem. It is a feeling of unsteadiness or light-headedness. You may feel like you are about to faint. Dizziness can lead to injury if you stumble or fall. Anyone can become dizzy, but dizziness is more common in older adults. This condition can be caused by a number of things, including medicines, dehydration, or illness. Follow these instructions at home: Eating and drinking  Drink enough fluid to keep your urine clear or pale yellow. This helps to keep you from becoming dehydrated. Try to drink more clear fluids, such as water.  Do not drink alcohol.  Limit your caffeine intake if told to do so by your health care provider. Check ingredients and nutrition facts to see if a food or beverage contains caffeine.  Limit your salt (sodium) intake if told to do so by your health care provider. Check ingredients and nutrition facts to see if a food or beverage contains sodium. Activity  Avoid making quick movements. ? Rise slowly from chairs and steady yourself until you feel okay. ? In the morning, first sit up on the side of the bed. When you feel okay, stand slowly while you hold onto something until you know that your balance is fine.  If you need to stand in one place for a long time, move your legs often. Tighten and relax the muscles in your legs while you are standing.  Do not drive or use heavy machinery if you feel dizzy.  Avoid bending down if you feel dizzy. Place items in your home so that they are easy for you to reach without leaning over. Lifestyle  Do not use any products that contain nicotine or tobacco, such as cigarettes and e-cigarettes. If you need help quitting, ask your health care provider.  Try to reduce your  stress level by using methods such as yoga or meditation. Talk with your health care provider if you need help to manage your stress. General instructions  Watch your dizziness for any changes.  Take over-the-counter and prescription medicines only as told by your health care provider. Talk with your health care provider if you think that your dizziness is caused by a medicine that you are taking.  Tell a friend or a family member that you are feeling dizzy. If he or she notices any changes in your behavior, have this person call your health care provider.  Keep all follow-up visits as told by your health care provider. This is important. Contact a health care provider if:  Your dizziness does not go away.  Your dizziness or light-headedness gets worse.  You feel nauseous.  You have reduced hearing.  You have new symptoms.  You are unsteady on your feet or you feel like the room is spinning. Get help right away if:  You vomit or have diarrhea and are unable to eat or drink anything.  You have problems talking, walking, swallowing, or using your arms, hands, or legs.  You feel generally weak.  You are not thinking clearly or you have trouble forming sentences. It may take a friend or family member to notice this.  You have chest pain, abdominal pain, shortness of breath, or sweating.  Your vision changes.  You have any bleeding.  You have a severe headache.  You have neck pain or a stiff neck.  You have a fever. These symptoms may represent a serious problem that is an emergency. Do not wait to see if the symptoms will go away. Get medical help right away. Call your local emergency services (911 in the [place] Do not drive yourself to the hospital. Summary  Dizziness is a feeling of unsteadiness or light-headedness. This condition can be caused by a number of things, including medicines, dehydration, or illness.  Anyone can become dizzy, but dizziness is more common in  older adults.  Drink enough fluid to keep your urine clear or pale yellow. Do not drink alcohol.  Avoid making quick movements if you feel dizzy. Monitor your dizziness for any changes. This information is not intended to replace advice given to you by your health care provider. Make sure you discuss any questions you have with your health care provider. Document Released: 11/20/2000 Document Revised: 06/29/2016 Document Reviewed: 06/29/2016 Elsevier Interactive Patient Education  2018 [employer].  Sinusitis, Adult Sinusitis is soreness and inflammation of your sinuses. Sinuses are hollow spaces in the bones around your face. Your sinuses are located:  Around your eyes.  In the middle of your forehead.  Behind your nose.  In your cheekbones.  Your sinuses and nasal passages are lined with a stringy fluid (mucus). Mucus normally drains out of your sinuses. When your nasal tissues become inflamed or swollen, the mucus can become trapped or blocked so air cannot flow through your sinuses. This allows bacteria, viruses, and funguses to grow, which leads to infection. Sinusitis can develop quickly and last for 7?10 days (acute) or for more than 12 weeks (chronic). Sinusitis often develops after a cold. What are the causes? This condition is caused by anything that creates swelling in the sinuses or stops mucus from draining, including:  Allergies.  Asthma.  Bacterial or viral infection.  Abnormally shaped bones between the nasal passages.  Nasal growths that contain mucus (nasal polyps).  Narrow sinus openings.  Pollutants, such as chemicals or irritants in the air.  A foreign object stuck in the nose.  A fungal infection. This is rare.  What increases the risk? The following factors may make you more likely to develop this condition:  Having allergies or asthma.  Having had a recent cold or respiratory tract infection.  Having structural deformities or blockages in  your nose or sinuses.  Having a weak immune system.  Doing a lot of swimming or diving.  Overusing nasal sprays.  Smoking.  What are the signs or symptoms? The main symptoms of this condition are pain and a feeling of pressure around the affected sinuses. Other symptoms include:  Upper toothache.  Earache.  Headache.  Bad breath.  Decreased sense of smell and taste.  A cough that may get worse at night.  Fatigue.  Fever.  Thick drainage from your nose. The drainage is often green and it may contain pus (purulent).  Stuffy nose or congestion.  Postnasal drip. This is when extra mucus collects in the throat or back of the nose.  Swelling and warmth over the affected sinuses.  Sore throat.  Sensitivity to light.  How is this diagnosed? This condition is diagnosed based on symptoms, a medical history, and a physical exam. To find out if your condition is acute or chronic, your health care provider may:  Look in your nose for signs of nasal polyps.  Tap over the affected sinus to check for signs of infection.  View the inside of your sinuses using an imaging device that has a light attached (endoscope).  If your health care provider suspects that you have chronic sinusitis, you may also:  Be tested for allergies.  Have a sample of mucus taken from your nose (nasal culture) and checked for bacteria.  Have a mucus sample examined to see if your sinusitis is related to an allergy.  If your sinusitis does not respond to treatment and it lasts longer than 8 weeks, you may have an MRI or CT scan to check your sinuses. These scans also help to determine how severe your infection is. In rare cases, a bone biopsy may be done to rule out more serious types of fungal sinus disease. How is this treated? Treatment for sinusitis depends on the cause and whether your condition is chronic or acute. If a virus is causing your sinusitis, your symptoms will go away on their own  within 10 days. You may be given medicines to relieve your symptoms, including:  Topical nasal decongestants. They shrink swollen nasal passages and let mucus drain from your sinuses.  Antihistamines. These drugs block inflammation that is triggered by allergies. This can help to ease swelling in your nose and sinuses.  Topical nasal corticosteroids. These are nasal sprays that ease inflammation and swelling in your nose and sinuses.  Nasal saline washes. These rinses can help to get rid of thick mucus in your nose.  If your condition is caused by bacteria, you will be given an antibiotic medicine. If your condition is caused by a fungus, you will be given an antifungal medicine. Surgery may be needed to correct underlying conditions, such as narrow nasal passages. Surgery may also be needed to remove polyps. Follow these instructions at home: Medicines  Take, use, or apply over-the-counter and prescription medicines only as told by your health care provider. These may include nasal sprays.  If you were prescribed an antibiotic medicine, take it as told by your health care provider. Do not stop taking the antibiotic even if you start to feel better. Hydrate and Humidify  Drink enough water to keep your urine clear or pale yellow. Staying hydrated will help to thin your mucus.  Use a cool mist humidifier to keep the humidity level in your home above 50%.  Inhale steam for 10-15 minutes, 3-4 times a day or as told by your health care provider. You can do this in the bathroom while a hot shower is running.  Limit your exposure to cool or dry air. Rest  Rest as much as possible.  Sleep with your head raised (elevated).  Make sure to get enough sleep each night. General instructions  Apply a warm, moist washcloth to your face 3-4 times a day or as told by your health care provider. This will help with discomfort.  Wash your hands often with soap and water to reduce your exposure to  viruses and other germs. If soap and water are not available, use hand sanitizer.  Do not smoke. Avoid being around people who are smoking (secondhand smoke).  Keep all follow-up visits as told by your health care provider. This is important. Contact a health care provider if:  You have a fever.  Your symptoms get worse.  Your symptoms do not improve within 10 days. Get help right away if:  You have a severe headache.  You have persistent vomiting.  You have pain  or swelling around your face or eyes.  You have vision problems.  You develop confusion.  Your neck is stiff.  You have trouble breathing. This information is not intended to replace advice given to you by your health care provider. Make sure you discuss any questions you have with your health care provider. Document Released: 05/27/2005 Document Revised: 01/21/2016 Document Reviewed: 03/22/2015 Elsevier Interactive Patient Education  [employer]2018 [employer] Inc[employer]

## 2017-08-20 ENCOUNTER — Other Ambulatory Visit: Payer: Self-pay | Admitting: Internal Medicine

## 2017-08-20 DIAGNOSIS — J329 Chronic sinusitis, unspecified: Secondary | ICD-10-CM

## 2017-08-20 MED ORDER — FLUTICASONE PROPIONATE 50 MCG/ACT NA SUSP: NASAL | 11 refills | Status: DC

## 2017-10-01 ENCOUNTER — Other Ambulatory Visit: Payer: Self-pay | Admitting: Internal Medicine

## 2017-10-01 ENCOUNTER — Telehealth: Payer: Self-pay | Admitting: Internal Medicine

## 2017-10-01 NOTE — Telephone Encounter (Signed)
Rx faxed to [facility] 

## 2017-10-01 NOTE — Telephone Encounter (Signed)
Okay to refill? Last filled on 04/02/17 #30 with 3 refills.  LOV: 04/02/17 NOV: N/A

## 2017-10-01 NOTE — Telephone Encounter (Signed)
Alprazolam Please notify patient that the prescription  was Refilled for 30 days only because it has been 7 months since last visit. Marland Kitchen.  OFFICE VISIT NEEDED prior to any more refills PLEASE SCHEDULE

## 2017-10-02 NOTE — Telephone Encounter (Signed)
°  Spoke with pt she is aware that before anymore refills on alprazolam  She need an appt

## 2017-10-02 NOTE — Telephone Encounter (Signed)
LMTCB. P[facility] may speak with pt. Need to inform pt that Dr. Darrick Huntsmanullo only refilled her alprazolam for 30 days because she needs to schedule a follow up appt with Dr. Darrick Huntsmanullo before getting any more refills.

## 2017-10-27 ENCOUNTER — Other Ambulatory Visit: Payer: Self-pay | Admitting: Internal Medicine

## 2017-12-04 ENCOUNTER — Other Ambulatory Visit: Payer: Self-pay | Admitting: Internal Medicine

## 2017-12-18 ENCOUNTER — Other Ambulatory Visit: Payer: Self-pay

## 2017-12-18 ENCOUNTER — Ambulatory Visit: Payer: Self-pay | Admitting: Internal Medicine

## 2017-12-18 ENCOUNTER — Emergency Department
Admission: EM | Admit: 2017-12-18 | Discharge: 2017-12-18 | Disposition: A | Discharge status: Home / Self Care | Attending: Emergency Medicine | Admitting: Emergency Medicine

## 2017-12-18 ENCOUNTER — Encounter: Payer: Self-pay | Admitting: Emergency Medicine

## 2017-12-18 DIAGNOSIS — K625 Hemorrhage of anus and rectum: Secondary | ICD-10-CM | POA: Diagnosis present

## 2017-12-18 DIAGNOSIS — K922 Gastrointestinal hemorrhage, unspecified: Secondary | ICD-10-CM | POA: Diagnosis not present

## 2017-12-18 DIAGNOSIS — Z79899 Other long term (current) drug therapy: Secondary | ICD-10-CM | POA: Diagnosis not present

## 2017-12-18 DIAGNOSIS — R109 Unspecified abdominal pain: Secondary | ICD-10-CM | POA: Diagnosis not present

## 2017-12-18 DIAGNOSIS — R197 Diarrhea, unspecified: Secondary | ICD-10-CM | POA: Insufficient documentation

## 2017-12-18 DIAGNOSIS — Z87891 Personal history of nicotine dependence: Secondary | ICD-10-CM | POA: Diagnosis not present

## 2017-12-18 LAB — COMPREHENSIVE METABOLIC PANEL
ALT: 15 U/L (ref 0–44)
ANION GAP: 9 (ref 5–15)
AST: 30 U/L (ref 15–41)
Albumin: 4.2 g/dL (ref 3.5–5.0)
Alkaline Phosphatase: 80 U/L (ref 38–126)
BILIRUBIN TOTAL: 0.7 mg/dL (ref 0.3–1.2)
BUN: 16 mg/dL (ref 6–20)
CO2: 22 mmol/L (ref 22–32)
Calcium: 9 mg/dL (ref 8.9–10.3)
Chloride: 107 mmol/L (ref 98–111)
Creatinine, Ser: 0.56 mg/dL (ref 0.44–1.00)
Glucose, Bld: 115 mg/dL — ABNORMAL HIGH (ref 70–99)
Potassium: 3.7 mmol/L (ref 3.5–5.1)
Sodium: 138 mmol/L (ref 135–145)
TOTAL PROTEIN: 7 g/dL (ref 6.5–8.1)

## 2017-12-18 LAB — CBC
HCT: 41.5 % (ref 35.0–47.0)
HEMOGLOBIN: 14.5 g/dL (ref 12.0–16.0)
MCH: 28.4 pg (ref 26.0–34.0)
MCHC: 34.9 g/dL (ref 32.0–36.0)
MCV: 81.3 fL (ref 80.0–100.0)
Platelets: 213 10*3/uL (ref 150–440)
RBC: 5.1 MIL/uL (ref 3.80–5.20)
RDW: 13.5 % (ref 11.5–14.5)
WBC: 5.5 10*3/uL (ref 3.6–11.0)

## 2017-12-18 LAB — TYPE AND SCREEN
ABO/RH(D): O POS
ANTIBODY SCREEN: NEGATIVE

## 2017-12-18 NOTE — Telephone Encounter (Signed)
Pt is going to ER at [facility].

## 2017-12-18 NOTE — ED Provider Notes (Signed)
[facility] Emergency Department Provider Note  ____________________________________________   First MD Initiated Contact with Patient 12/18/17 1241     (approximate)  I have reviewed the triage vital signs and the nursing notes.   HISTORY  Chief Complaint Rectal Bleeding and Abdominal Pain    HPI Michelle Mahoney is a 62 y.o. female with medical history as listed below who presents by private vehicle for evaluation of some recent diarrhea starting yesterday as well as some lower abdominal pain which has resolved.  She reports that she "did not feel right" yesterday when she woke up and then she had 4 episodes of loose watery stool that looked black.  She had no pain or discomfort at all yesterday.  She had no issues overnight and did not have to get up to go to the bathroom at all during the night.  Today the diarrhea has improved and she has had 2 bowel movements that are still soft but less watery than before but she noticed a little bit of red blood in 1 of the bowel movements.  She also had some crampy lower abdominal pain that lasted about an hour but has completely resolved.  She called her primary care doctor and they could not see her.  However they encouraged her to go to the emergency department.  She called and made an appointment with Dr. Lemar Livings, her surgeon who has performed a colonoscopy within the last few years on her.  She has not had similar symptoms in the past.  She describes it as mild but states that her primary care doctor's office scared her on the phone when they told her that she should go to the emergency department.  She denies fever/chills, chest pain, shortness of breath, nausea, vomiting, and any abdominal pain other than the mild and relatively brief lower abdominal cramping pain she had earlier today.  She is not on any blood thinners.  She does not drink alcohol or smoke.  Nothing in particular made the symptoms better or worse.  She feels  better today overall than she did yesterday.   Past Medical History:  Diagnosis Date  . Allergy    pollen   . Chicken pox   . Sinus bradycardia     Patient Active Problem List   Diagnosis Date Noted  . Hyperlipidemia 04/04/2017  . 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 Strain of calf muscle 02/12/2016  . Routine general medical examination at a health care facility 12/11/2013  . Overweight (BMI 25.0-29.9) 10/25/2013  . Anxiety state 10/25/2013  . Screening for colon cancer 10/25/2013    Past Surgical History:  Procedure Laterality Date  . ABDOMINAL HYSTERECTOMY  2001  . LESION EXCISION  2008   anal area    Prior to Admission medications   Medication Sig Start Date End Date Taking? Authorizing Provider  ALPRAZolam Prudy Feeler) 0.5 MG tablet TAKE 1 TABLET BY MOUTH DAILY AS NEEDED FOR ANXIETY 10/01/17   Sherlene Shams, MD  azithromycin (ZITHROMAX) 250 MG tablet 2 pills day 1 and 1 pill day 2-5 07/25/17   McLean-Scocuzza, Pasty Spillers, MD  cetirizine (ZYRTEC) 10 MG tablet Take 10 mg by mouth as needed.     [provider]  escitalopram (LEXAPRO) 10 MG tablet TAKE 1 TABLET (10 MG TOTAL) BY MOUTH DAILY. 12/04/17   Sherlene Shams, MD  fluticasone Aleda Grana) 50 MCG/ACT nasal spray 1-2 sprays  1x per total per day 08/20/17   McLean-Scocuzza, Pasty Spillers, MD  Multiple Vitamins-Minerals (MULTIVITAMIN WITH MINERALS) tablet Take 1 tablet by mouth daily.    [provider]  naltrexone (DEPADE) 50 MG tablet Take 0.5 tablets (25 mg total) by mouth daily. 04/02/17   Sherlene Shams, MD  sodium chloride (OCEAN) 0.65 % SOLN nasal spray 1-2 sprays 1x per day 07/25/17   McLean-Scocuzza, Pasty Spillers, MD  traZODone (DESYREL) 50 MG tablet TAKE 1/2-1 TABLET BY MOUTH AT BEDTIME IF NEEDED FOR SLEEP 10/27/17   Sherlene Shams, MD    Allergies Patient has no known allergies.  Family History  Problem Relation Age of Onset  . Cancer Mother 40       melanoma  . Stroke Mother 60       secondary to  septic emboli spine infection  . Heart disease Father 26       Heart failure  . Mental illness Father 68       dementia  . Breast cancer Neg Hx     Social History Social History   Tobacco Use  . Smoking status: Former Games developer  . Smokeless tobacco: Never Used  Substance Use Topics  . Alcohol use: Yes    Comment: rarely  . Drug use: No    Review of Systems Constitutional: No fever/chills Eyes: No visual changes. ENT: No sore throat. Cardiovascular: Denies chest pain. Respiratory: Denies shortness of breath. Gastrointestinal: Dark watery diarrhea yesterday, improving today but with a small amount of bright red blood and some resolved lower abdominal cramping.  As described above in HPI. Genitourinary: Denies dysuria Musculoskeletal: Negative for neck pain.  Negative for back pain. Integumentary: Negative for rash. Neurological: Negative for headaches, focal weakness or numbness.   ____________________________________________   PHYSICAL EXAM:  VITAL SIGNS: ED Triage Vitals  Enc Vitals Group     BP 12/18/17 1110 138/73     Pulse Rate 12/18/17 1110 68     Resp 12/18/17 1110 18     Temp 12/18/17 1110 98.5 F (36.9 C)     Temp Source 12/18/17 1110 Oral     SpO2 12/18/17 1110 95 %     Weight 12/18/17 1111 62.6 kg (138 lb)     Height 12/18/17 1111 1.575 m (5\' 2" )     Head Circumference --      Peak Flow --      Pain Score 12/18/17 1110 0     Pain Loc --      Pain Edu? --      Excl. in GC? --     Constitutional: Alert and oriented. Well appearing and in no acute distress. Eyes: Conjunctivae are normal.  Head: Atraumatic. Nose: No congestion/rhinnorhea. Mouth/Throat: Mucous membranes are moist. Neck: No stridor.  No meningeal signs.   Cardiovascular: Normal rate, regular rhythm. Good peripheral circulation. Grossly normal heart sounds. Respiratory: Normal respiratory effort.  No retractions. Lungs CTAB. Gastrointestinal: Soft and nondistended.  Very minimal  tenderness to palpation of the suprapubic region.  No rebound and no guarding. Rectal: Small external hemorrhoids but without any evidence of bleeding or thrombosis.  Nontender digital exam.  Almost no stool in rectal vault, but the small amount that was present was light brown and nonbloody and not melanotic.  However the Hemoccult card was strongly positive.  Quality control passed.  ED chaperone present throughout exam. Musculoskeletal: No lower extremity tenderness nor edema. No gross deformities of extremities. Neurologic:  Normal speech and language. No gross focal neurologic deficits are appreciated.  Skin:  Skin is warm, dry and  intact. No rash noted. Psychiatric: Mood and affect are normal. Speech and behavior are normal.  ____________________________________________   LABS (all labs ordered are listed, but only abnormal results are displayed)  Labs Reviewed  COMPREHENSIVE METABOLIC PANEL - Abnormal; Notable for the following components:      Result Value   Glucose, Bld 115 (*)    All other components within normal limits  CBC  POC OCCULT BLOOD, ED  TYPE AND SCREEN   ____________________________________________  EKG  None - EKG not ordered by ED physician ____________________________________________  RADIOLOGY   ED MD interpretation: No imaging obtained in the emergency department  Official radiology report(s): No results found.  ____________________________________________   PROCEDURES  Critical Care performed: No   Procedure(s) performed:   Procedures   ____________________________________________   INITIAL IMPRESSION / ASSESSMENT AND PLAN / ED COURSE  As part of my medical decision making, I reviewed the following data within the electronic MEDICAL RECORD NUMBER Nursing notes reviewed and incorporated, Labs reviewed  and Old chart reviewed    Differential diagnosis includes, but is not limited to, viral infection leading to diarrhea and some mild  rectal bleeding, diverticulosis and/or diverticulitis, AV malformation, appendicitis, neoplasm, etc.  The patient is completely stable at this time with normal vital signs and no abdominal pain.  She has some very mild tenderness to palpation of the suprapubic region but no dysuria.  Her lab work-up is all reassuring with no evidence of anemia, normal conference of metabolic panel, and no other symptoms at this time.  She is not high risk and has had a colonoscopy within the last couple of years.  She is in no pain or discomfort at this time.  I discussed with her additional imaging such as a CT scan, but I explained that given that she is having no pain and only very minimal tenderness to one small area of her lower abdomen, I did not think that a CT scan would reveal any acute abnormalities.  She has no signs of infection at this time and a CT scan even with oral and IV contrast is not likely to show the cause of her GI bleeding.  She understands and already has an appointment scheduled with her surgeon in about 5 days.  She is comfortable with outpatient follow-up.  I gave her strict return precautions that should any of her symptoms worsen or should she develop new symptoms, she should come back immediately to the emergency department.  She understands and agrees with this plan.     ____________________________________________  FINAL CLINICAL IMPRESSION(S) / ED DIAGNOSES  Final diagnoses:  Gastrointestinal hemorrhage, unspecified gastrointestinal hemorrhage type  Diarrhea, unspecified type     MEDICATIONS GIVEN DURING THIS VISIT:  Medications - No data to display   ED Discharge Orders    None       Note:  This document was prepared using Dragon voice recognition software and may include unintentional dictation errors.    Loleta RoseForbach, Geordie Nooney, MD 12/18/17 1332

## 2017-12-18 NOTE — Telephone Encounter (Signed)
fyi

## 2017-12-18 NOTE — Telephone Encounter (Signed)
Pt c/o black diarrhea and dk red blood mixed in stool and on toilet paper. Diarrhea began yesterday. Pt with crampy lower abdominal pain that she rates as mild and it comes and goes. Pt has had 3-4 stools in the past 24 hours. Pt states she is drinking water but has only had 2 glasses of water in the past 24 hours. She denies dry mouth, dizziness, vomiting, dizziness and fever. Pt advised to be seen at the ER. Care advice given. Pt stated that she will be going [facility].   Reason for Disposition . Age > 50 years    Pt advised to go to the ED due to the black diarrhea and rectal blood . Tarry or jet black-colored stool (not dark green)    Pt having black diarrhea . Black or tarry bowel movements  (Exception: chronic-unchanged  black-grey bowel movements AND is taking iron pills or Pepto-bismol)  Answer Assessment - Initial Assessment Questions 1. DIARRHEA SEVERITY: "How bad is the diarrhea?" "How many extra stools have you had in the past 24 hours than normal?"    - NO DIARRHEA (SCALE 0)   - MILD (SCALE 1-3): Few loose or mushy BMs; increase of 1-3 stools over normal daily number of stools; mild increase in ostomy output.   -  MODERATE (SCALE 4-7): Increase of 4-6 stools daily over normal; moderate increase in ostomy output. * SEVERE (SCALE 8-10; OR 'WORST POSSIBLE'): Increase of 7 or more stools daily over normal; moderate increase in ostomy output; incontinence.     mild 2. ONSET: "When did the diarrhea begin?"      yesterday 3. BM CONSISTENCY: "How loose or watery is the diarrhea?"      loose 4. VOMITING: "Are you also vomiting?" If so, ask: "How many times in the past 24 hours?"      no 5. ABDOMINAL PAIN: "Are you having any abdominal pain?" If yes: "What does it feel like?" (e.g., crampy, dull, intermittent, constant)      Crampy lower abdomen 6. ABDOMINAL PAIN SEVERITY: If present, ask: "How bad is the pain?"  (e.g., Scale 1-10; mild, moderate, or severe)   - MILD (1-3): doesn't  interfere with normal activities, abdomen soft and not tender to touch    - MODERATE (4-7): interferes with normal activities or awakens from sleep, tender to touch    - SEVERE (8-10): excruciating pain, doubled over, unable to do any normal activities       Mild comes and goes 7. ORAL INTAKE: If vomiting, "Have you been able to drink liquids?" "How much fluids have you had in the past 24 hours?"     Yes-2 glasses 8. HYDRATION: "Any signs of dehydration?" (e.g., dry mouth [not just dry lips], too weak to stand, dizziness, new weight loss) "When did you last urinate?"     No-1 hour ago 9. EXPOSURE: "Have you traveled to a foreign country recently?" "Have you been exposed to anyone with diarrhea?" "Could you have eaten any food that was spoiled?"     No-no-no 10. ANTIBIOTIC USE: "Are you taking antibiotics now or have you taken antibiotics in the past 2 months?"       no 11. OTHER SYMPTOMS: "Do you have any other symptoms?" (e.g., fever, blood in stool)       Black stools and noted dk red blood noticed on toilet paper and om stool- pt states that she has been straining to have BM 12. PREGNANCY: "Is there any chance you are pregnant?" "When  was your last menstrual period?"       n/a  Answer Assessment - Initial Assessment Questions 1. APPEARANCE of BLOOD: "What color is it?" "Is it passed separately, on the surface of the stool, or mixed in with the stool?"      dk red- mixed in with stool and separate on the toilet paper 2. AMOUNT: "How much blood was passed?"      Small amount 3. FREQUENCY: "How many times has blood been passed with the stools?"      2 times 4. ONSET: "When was the blood first seen in the stools?" (Days or weeks)      This am  5. DIARRHEA: "Is there also some diarrhea?" If so, ask: "How many diarrhea stools were passed in past 24 hours?"      Yes-3-4 6. CONSTIPATION: "Do you have constipation?" If so, "How bad is it?"     No  7. RECURRENT SYMPTOMS: "Have you had blood in  your stools before?" If so, ask: "When was the last time?" and "What happened that time?"      Yes from straining-years ago-stool test and colonoscopy  8. BLOOD THINNERS: "Do you take any blood thinners?" (e.g., Coumadin/warfarin, Pradaxa/dabigatran, aspirin)     no 9. OTHER SYMPTOMS: "Do you have any other symptoms?"  (e.g., abdominal pain, vomiting, dizziness, fever)     Crampy abdominal pain  10. PREGNANCY: "Is there any chance you are pregnant?" "When was your last menstrual period?"       n/a  Protocols used: RECTAL BLEEDING-A-AH, DIARRHEA-A-AH

## 2017-12-18 NOTE — Discharge Instructions (Signed)
You have been seen in the Emergency Department (ED) for GI bleeding.  Your evaluation did not identify a clear cause of your symptoms but was generally reassuring.  You do not require admission to the hospital at this time, but we encourage close outpatient follow up with the doctor(s) listed above in this paperwork. ° °Return to the ED if your abdominal pain worsens or fails to improve, you are unable to tolerate fluids due to vomiting, fever greater than 101, or you develop other symptoms that concern you. ° °

## 2017-12-18 NOTE — ED Triage Notes (Signed)
Pt presents to ED via POV with c/o lower abdominal pain and black diarrhea that started yesterday. Pt reports 1 or 2 episodes of diarrhea this morning that was black with some bright red today, reports yesterday diarrhea was black. Pt denies hx of rectal bleeding at this time, states abdominal pain has since resolved.

## 2017-12-22 ENCOUNTER — Encounter: Payer: Self-pay | Admitting: *Deleted

## 2017-12-23 ENCOUNTER — Ambulatory Visit: Payer: Self-pay | Admitting: General Surgery

## 2018-02-12 ENCOUNTER — Other Ambulatory Visit: Payer: Self-pay | Admitting: Internal Medicine

## 2018-02-12 NOTE — Telephone Encounter (Signed)
Xanax 0.5 mg refill Last Refill:10/01/17 # 30   0 refills Last OV: 07/25/17 with Dr. Shirlee Latch PCP: Darrick Huntsman Pharmacy:[facility] [phone]  She is requesting enough to get  Her to her appt on 03/30/18.

## 2018-02-12 NOTE — Telephone Encounter (Signed)
Copied from CRM [id number]. Topic: Quick Communication - Rx Refill/Question >> Feb 12, 2018  2:56 PM Angela Nevin wrote: Medication: ALPRAZolam Prudy Feeler) 0.5 MG tablet [[id number]]   Pt states that she is completely out of this medication and is requesting a partial refill to get her to her next appointment on 10/21. Please advise.   Has the patient contacted their pharmacy? Yes, pharmacy advised pt to contact office.   Preferred Pharmacy (with phone number or street name): [phone] [place], [place] - [address] [phone] (Phone) [phone] (Fax)

## 2018-02-13 MED ORDER — ALPRAZOLAM 0.5 MG PO TABS: 0.5000 mg | ORAL_TABLET | Freq: Every day | ORAL | 0 refills | Status: DC | PRN

## 2018-02-13 NOTE — Telephone Encounter (Signed)
Please notify patient that the prescriptions was  Refilled for 30 days

## 2018-02-18 ENCOUNTER — Encounter: Payer: Self-pay | Admitting: *Deleted

## 2018-03-08 ENCOUNTER — Other Ambulatory Visit: Payer: Self-pay | Admitting: Internal Medicine

## 2018-03-20 ENCOUNTER — Ambulatory Visit: Admitting: Internal Medicine

## 2018-03-30 ENCOUNTER — Ambulatory Visit (INDEPENDENT_AMBULATORY_CARE_PROVIDER_SITE_OTHER): Admitting: Internal Medicine

## 2018-03-30 ENCOUNTER — Encounter: Payer: Self-pay | Admitting: Internal Medicine

## 2018-03-30 VITALS — BP 150/84 | HR 62 | Temp 98.3°F | Resp 14 | Ht 62.0 in | Wt 145.4 lb

## 2018-03-30 DIAGNOSIS — F5105 Insomnia due to other mental disorder: Secondary | ICD-10-CM

## 2018-03-30 DIAGNOSIS — K921 Melena: Secondary | ICD-10-CM

## 2018-03-30 DIAGNOSIS — R195 Other fecal abnormalities: Secondary | ICD-10-CM

## 2018-03-30 DIAGNOSIS — R030 Elevated blood-pressure reading, without diagnosis of hypertension: Secondary | ICD-10-CM

## 2018-03-30 DIAGNOSIS — F419 Anxiety disorder, unspecified: Secondary | ICD-10-CM | POA: Diagnosis not present

## 2018-03-30 DIAGNOSIS — F411 Generalized anxiety disorder: Secondary | ICD-10-CM | POA: Diagnosis not present

## 2018-03-30 LAB — COMPREHENSIVE METABOLIC PANEL
ALT: 10 U/L (ref 0–35)
AST: 20 U/L (ref 0–37)
Albumin: 4.4 g/dL (ref 3.5–5.2)
Alkaline Phosphatase: 74 U/L (ref 39–117)
BUN: 16 mg/dL (ref 6–23)
CHLORIDE: 105 meq/L (ref 96–112)
CO2: 31 meq/L (ref 19–32)
CREATININE: 0.73 mg/dL (ref 0.40–1.20)
Calcium: 9.4 mg/dL (ref 8.4–10.5)
GFR: 86.19 mL/min (ref >60.00)
GLUCOSE: 95 mg/dL (ref 70–99)
Potassium: 3.8 mEq/L (ref 3.5–5.1)
SODIUM: 143 meq/L (ref 135–145)
Total Bilirubin: 0.4 mg/dL (ref 0.2–1.2)
Total Protein: 6.7 g/dL (ref 6.0–8.3)

## 2018-03-30 MED ORDER — ZOSTER VAC RECOMB ADJUVANTED 50 MCG/0.5ML IM SUSR: 0.5000 mL | Freq: Once | INTRAMUSCULAR | 1 refills | Status: AC

## 2018-03-30 MED ORDER — TRAZODONE HCL 50 MG PO TABS: ORAL_TABLET | ORAL | 2 refills | Status: DC

## 2018-03-30 MED ORDER — ALPRAZOLAM 0.5 MG PO TABS
0.5000 mg | ORAL_TABLET | Freq: Every day | ORAL | 5 refills | Status: DC | PRN
Start: 2018-03-30 — End: 2018-10-09

## 2018-03-30 NOTE — Progress Notes (Signed)
Subjective:  Patient ID: Michelle Mahoney, female    DOB: [birth year]  Age: 62 y.o. MRN: [id number]  CC: The primary encounter diagnosis was Elevated blood pressure reading without diagnosis of hypertension. Diagnoses of Hematochezia, Anxiety state, Elevated blood pressure reading in office with white coat syndrome, without diagnosis of hypertension, Insomnia secondary to anxiety, and Occult blood positive stool were also pertinent to this visit.  HPI [name] presents for medication refill.   Last seen one year ago for cpe.  Repeat PAP smear was recommended due to "ATROPHY" .  Discussed the most recent guidelines from ACOG recommending 3 YEAR FOLLOW UP   Treated in ER on July 11 for GI bleed  When she presented  With diarrhea and black stools,  Stool was brown but guiaic positive  In the setting of mild  abd pain and gastroenteritis scenario. Abdominal  CT deferred  And follow up with GI was intended but not done bc she felt better after 24 hours and stools have been brown.  Reviewed last colonoscopy report , ER VISIT AND LABS   Anxiety: recent stressors ongoing: Cognitive decline of mother who is 62  And lives at home with multiple caregivers after refusing to transition to [employer].  Mother has become suspicious of patient's handling of financial affairs ,  Has taken her off of her bank accounts and has given POA to one of her paid caregivers.  Mother's  Primary care Physician has declared her competent to make decisioins,  So patient  Is powerless to  Intervene. Using trazodone for chronic insomnia,  lexapro daily,  And alprazolam in the am.   Outpatient Medications Prior to Visit  Medication Sig Dispense Refill  . escitalopram (LEXAPRO) 10 MG tablet TAKE 1 TABLET BY MOUTH EVERY DAY 90 tablet 0  . Multiple Vitamins-Minerals (MULTIVITAMIN WITH MINERALS) tablet Take 1 tablet by mouth daily.    Marland Kitchen ALPRAZolam (XANAX) 0.5 MG tablet Take 1 tablet (0.5 mg total) by mouth daily as needed. for  anxiety (Patient taking differently: Take 0.5 mg by mouth daily as needed. for anxiety) 30 tablet 0  . traZODone (DESYREL) 50 MG tablet TAKE 1/2-1 TABLET BY MOUTH AT BEDTIME IF NEEDED FOR SLEEP 30 tablet 2  . azithromycin (ZITHROMAX) 250 MG tablet 2 pills day 1 and 1 pill day 2-5 (Patient not taking: Reported on 03/30/2018) 6 tablet 0  . cetirizine (ZYRTEC) 10 MG tablet Take 10 mg by mouth as needed.     . fluticasone (FLONASE) 50 MCG/ACT nasal spray 1-2 sprays  1x per total per day (Patient not taking: Reported on 03/30/2018) 16 g 11  . naltrexone (DEPADE) 50 MG tablet Take 0.5 tablets (25 mg total) by mouth daily. (Patient not taking: Reported on 03/30/2018) 30 tablet 2  . sodium chloride (OCEAN) 0.65 % SOLN nasal spray 1-2 sprays 1x per day (Patient not taking: Reported on 03/30/2018) 1 Bottle 2   No facility-administered medications prior to visit.     Review of Systems;  Patient denies headache, fevers, malaise, unintentional weight loss, skin rash, eye pain, sinus congestion and sinus pain, sore throat, dysphagia,  hemoptysis , cough, dyspnea, wheezing, chest pain, palpitations, orthopnea, edema, abdominal pain, nausea, melena, diarrhea, constipation, flank pain, dysuria, hematuria, urinary  Frequency, nocturia, numbness, tingling, seizures,  Focal weakness, Loss of consciousness,  Tremor, insomnia, depression, anxiety, and suicidal ideation.      Objective:  BP (!) 150/84 (BP Location: Left Arm, Patient Position: Sitting, Cuff Size: Normal)  Pulse 62   Temp 98.3 F (36.8 C) (Oral)   Resp 14   Ht 5\' 2"  (1.575 m)   Wt 145 lb 6.4 oz (66 kg)   SpO2 95%   BMI 26.59 kg/m   BP Readings from Last 3 Encounters:  03/30/18 (!) 150/84  12/18/17 128/74  07/25/17 140/70    Wt Readings from Last 3 Encounters:  03/30/18 145 lb 6.4 oz (66 kg)  12/18/17 138 lb (62.6 kg)  07/25/17 145 lb 3.2 oz (65.9 kg)    General appearance: alert, cooperative and appears stated age Ears: normal TM's  and external ear canals both ears Throat: lips, mucosa, and tongue normal; teeth and gums normal Neck: no adenopathy, no carotid bruit, supple, symmetrical, trachea midline and thyroid not enlarged, symmetric, no tenderness/mass/nodules Back: symmetric, no curvature. ROM normal. No CVA tenderness. Lungs: clear to auscultation bilaterally Heart: regular rate and rhythm, S1, S2 normal, no murmur, click, rub or gallop Abdomen: soft, non-tender; bowel sounds normal; no masses,  no organomegaly Pulses: 2+ and symmetric Skin: Skin color, texture, turgor normal. No rashes or lesions Lymph nodes: Cervical, supraclavicular, and axillary nodes normal.  Lab Results  Component Value Date   HGBA1C 5.4 12/22/2014    Lab Results  Component Value Date   CREATININE 0.73 03/30/2018   CREATININE 0.56 12/18/2017   CREATININE 0.79 04/02/2017    Lab Results  Component Value Date   WBC 5.5 12/18/2017   HGB 14.5 12/18/2017   HCT 41.5 12/18/2017   PLT 213 12/18/2017   GLUCOSE 95 03/30/2018   CHOL 251 (H) 04/02/2017   TRIG 117.0 04/02/2017   HDL 74.70 04/02/2017   LDLCALC 153 (H) 04/02/2017   ALT 10 03/30/2018   AST 20 03/30/2018   NA 143 03/30/2018   K 3.8 03/30/2018   CL 105 03/30/2018   CREATININE 0.73 03/30/2018   BUN 16 03/30/2018   CO2 31 03/30/2018   TSH 2.66 04/02/2017   HGBA1C 5.4 12/22/2014    No results found.  Assessment & Plan:   Problem List Items Addressed This Visit    Anxiety state     CONTINUE Lexapro daily,  Trazodone qhs and minimize use of alprazolam to prn.A total of 40 minutes was spent with patient,  more than half of which was spent in counseling patient on the above mentioned issues ,      Relevant Medications   ALPRAZolam (XANAX) 0.5 MG tablet   traZODone (DESYREL) 50 MG tablet   Elevated blood pressure reading in office with white coat syndrome, without diagnosis of hypertension    She has no history of hypertension but has had several elevated readings.   She has been asked to check her pressures at home and submit readings for evaluation. Renal function normal,  checking urine for proteinuria Lab Results  Component Value Date   CREATININE 0.73 03/30/2018   Lab Results  Component Value Date   NA 143 03/30/2018   K 3.8 03/30/2018   CL 105 03/30/2018   CO2 31 03/30/2018   No results found for: MICROALBUR, [id number]       Insomnia secondary to anxiety    Continue lexapro  , and trazodone for insomnia . Prn alprazolam . The risks and benefits of benzodiazepine use were discussed with patient today including excessive sedation leading to respiratory depression,  impaired thinking/driving, and addiction.  Patient was advised to avoid concurrent use with alcohol, to use medication only as needed and not to share with others  .  Relevant Medications   ALPRAZolam (XANAX) 0.5 MG tablet   traZODone (DESYREL) 50 MG tablet   Occult blood positive stool    In the setting of acute gastroenteritis July 2019.  Normal colonoscopy 2015.  Patient given home IFOBT to recheck.        Other Visit Diagnoses    Elevated blood pressure reading without diagnosis of hypertension    -  Primary   Relevant Orders   Comprehensive metabolic panel (Completed)   Microalbumin / creatinine urine ratio   Hematochezia       Relevant Orders   Fecal occult blood, imunochemical     A total of 40 minutes was spent with patient more than half of which was spent in counseling patient on the above mentioned issues , reviewing and explaining recent labs and imaging studies done, and coordination of care.  I have discontinued Lucy T. Overall's cetirizine, naltrexone, sodium chloride, azithromycin, and fluticasone. I have also changed her traZODone. Additionally, I am having her start on Zoster Vaccine Adjuvanted. Lastly, I am having her maintain her multivitamin with minerals, escitalopram, and ALPRAZolam.  Meds ordered this encounter  Medications  . ALPRAZolam  (XANAX) 0.5 MG tablet    Sig: Take 1 tablet (0.5 mg total) by mouth daily as needed. for anxiety    Dispense:  30 tablet    Refill:  5  . traZODone (DESYREL) 50 MG tablet    Sig: One tablet at bedtime as needed for insomnia    Dispense:  90 tablet    Refill:  2  . Zoster Vaccine Adjuvanted [facility]) injection    Sig: Inject 0.5 mLs into the muscle once for 1 dose.    Dispense:  1 each    Refill:  1    Medications Discontinued During This Encounter  Medication Reason  . azithromycin (ZITHROMAX) 250 MG tablet Patient has not taken in last 30 days  . cetirizine (ZYRTEC) 10 MG tablet   . fluticasone (FLONASE) 50 MCG/ACT nasal spray   . naltrexone (DEPADE) 50 MG tablet   . sodium chloride (OCEAN) 0.65 % SOLN nasal spray   . ALPRAZolam (XANAX) 0.5 MG tablet Reorder  . traZODone (DESYREL) 50 MG tablet Reorder    Follow-up: Return in about 3 months (around 06/30/2018) for CPE without PAP .   Sherlene Shams, MD

## 2018-03-30 NOTE — Patient Instructions (Addendum)
The new goals for optimal blood pressure management are 120/70.  Please check your blood pressure a few times at [facility] and send me the readings so I can determine if you need  To start  medication    PAP smear due in 2021  CPE in Jan 2020,  Before your mammogram is due    Home stool test cards should be used if you develop abominal pain or dark colored stools so we can rule out bleeding    The ShingRx vaccine is now available in local pharmacies and is much more protective thant Zostavaxs,  It is therefore ADVISED for all interested adults over 50 to prevent shingles    Managing Your Hypertension Hypertension is commonly called high blood pressure. This is when the force of your blood pressing against the walls of your arteries is too strong. Arteries are blood vessels that carry blood from your heart throughout your body. Hypertension forces the heart to work harder to pump blood, and may cause the arteries to become narrow or stiff. Having untreated or uncontrolled hypertension can cause heart attack, stroke, kidney disease, and other problems. What are blood pressure readings? A blood pressure reading consists of a higher number over a lower number. Ideally, your blood pressure should be below 120/80. The first ("top") number is called the systolic pressure. It is a measure of the pressure in your arteries as your heart beats. The second ("bottom") number is called the diastolic pressure. It is a measure of the pressure in your arteries as the heart relaxes. What does my blood pressure reading mean? Blood pressure is classified into four stages. Based on your blood pressure reading, your health care provider may use the following stages to determine what type of treatment you need, if any. Systolic pressure and diastolic pressure are measured in a unit called mm Hg. Normal  Systolic pressure: below 120.  Diastolic pressure: below 80. Elevated  Systolic pressure:  120-129.  Diastolic pressure: below 80. Hypertension stage 1  Systolic pressure: 130-139.  Diastolic pressure: 80-89. Hypertension stage 2  Systolic pressure: 140 or above.  Diastolic pressure: 90 or above. What health risks are associated with hypertension? Managing your hypertension is an important responsibility. Uncontrolled hypertension can lead to:  A heart attack.  A stroke.  A weakened blood vessel (aneurysm).  Heart failure.  Kidney damage.  Eye damage.  Metabolic syndrome.  Memory and concentration problems.  What changes can I make to manage my hypertension? Hypertension can be managed by making lifestyle changes and possibly by taking medicines. Your health care provider will help you make a plan to bring your blood pressure within a normal range. Eating and drinking  Eat a diet that is high in fiber and potassium, and low in salt (sodium), added sugar, and fat. An example eating plan is called the DASH (Dietary Approaches to Stop Hypertension) diet. To eat this way: ? Eat plenty of fresh fruits and vegetables. Try to fill half of your plate at each meal with fruits and vegetables. ? Eat whole grains, such as whole wheat pasta, brown rice, or whole grain bread. Fill about one quarter of your plate with whole grains. ? Eat low-fat diary products. ? Avoid fatty cuts of meat, processed or cured meats, and poultry with skin. Fill about one quarter of your plate with lean proteins such as fish, chicken without skin, beans, eggs, and tofu. ? Avoid premade and processed foods. These tend to be higher in sodium, added sugar,  and fat.  Reduce your daily sodium intake. Most people with hypertension should eat less than 1,500 mg of sodium a day.  Limit alcohol intake to no more than 1 drink a day for nonpregnant women and 2 drinks a day for men. One drink equals 12 oz of beer, 5 oz of wine, or 1 oz of hard liquor. Lifestyle  Work with your health care provider to  maintain a healthy body weight, or to lose weight. Ask what an ideal weight is for you.  Get at least 30 minutes of exercise that causes your heart to beat faster (aerobic exercise) most days of the week. Activities may include walking, swimming, or biking.  Include exercise to strengthen your muscles (resistance exercise), such as weight lifting, as part of your weekly exercise routine. Try to do these types of exercises for 30 minutes at least 3 days a week.  Do not use any products that contain nicotine or tobacco, such as cigarettes and e-cigarettes. If you need help quitting, ask your health care provider.  Control any long-term (chronic) conditions you have, such as high cholesterol or diabetes. Monitoring  Monitor your blood pressure at home as told by your health care provider. Your personal target blood pressure may vary depending on your medical conditions, your age, and other factors.  Have your blood pressure checked regularly, as often as told by your health care provider. Working with your health care provider  Review all the medicines you take with your health care provider because there may be side effects or interactions.  Talk with your health care provider about your diet, exercise habits, and other lifestyle factors that may be contributing to hypertension.  Visit your health care provider regularly. Your health care provider can help you create and adjust your plan for managing hypertension. Will I need medicine to control my blood pressure? Your health care provider may prescribe medicine if lifestyle changes are not enough to get your blood pressure under control, and if:  Your systolic blood pressure is 130 or higher.  Your diastolic blood pressure is 80 or higher.  Take medicines only as told by your health care provider. Follow the directions carefully. Blood pressure medicines must be taken as prescribed. The medicine does not work as well when you skip doses.  Skipping doses also puts you at risk for problems. Contact a health care provider if:  You think you are having a reaction to medicines you have taken.  You have repeated (recurrent) headaches.  You feel dizzy.  You have swelling in your ankles.  You have trouble with your vision. Get help right away if:  You develop a severe headache or confusion.  You have unusual weakness or numbness, or you feel faint.  You have severe pain in your chest or abdomen.  You vomit repeatedly.  You have trouble breathing. Summary  Hypertension is when the force of blood pumping through your arteries is too strong. If this condition is not controlled, it may put you at risk for serious complications.  Your personal target blood pressure may vary depending on your medical conditions, your age, and other factors. For most people, a normal blood pressure is less than 120/80.  Hypertension is managed by lifestyle changes, medicines, or both. Lifestyle changes include weight loss, eating a healthy, low-sodium diet, exercising more, and limiting alcohol. This information is not intended to replace advice given to you by your health care provider. Make sure you discuss any questions you have with  your health care provider. Document Released: 02/19/2012 Document Revised: 04/24/2016 Document Reviewed: 04/24/2016 Elsevier Interactive Patient Education  [employer].

## 2018-03-31 DIAGNOSIS — I10 Essential (primary) hypertension: Secondary | ICD-10-CM | POA: Insufficient documentation

## 2018-03-31 DIAGNOSIS — R195 Other fecal abnormalities: Secondary | ICD-10-CM | POA: Insufficient documentation

## 2018-03-31 NOTE — Assessment & Plan Note (Signed)
She has no history of hypertension but has had several elevated readings.  She has been asked to check her pressures at home and submit readings for evaluation. Renal function normal,  checking urine for proteinuria Lab Results  Component Value Date   CREATININE 0.73 03/30/2018   Lab Results  Component Value Date   NA 143 03/30/2018   K 3.8 03/30/2018   CL 105 03/30/2018   CO2 31 03/30/2018   No results found for: Concepcion Elk

## 2018-03-31 NOTE — Assessment & Plan Note (Addendum)
Continue lexapro  , and trazodone for insomnia . Prn alprazolam . The risks and benefits of benzodiazepine use were discussed with patient today including excessive sedation leading to respiratory depression,  impaired thinking/driving, and addiction.  Patient was advised to avoid concurrent use with alcohol, to use medication only as needed and not to share with others  .

## 2018-03-31 NOTE — Assessment & Plan Note (Addendum)
CONTINUE Lexapro daily,  Trazodone qhs and minimize use of alprazolam to prn.A total of 40 minutes was spent with patient,  more than half of which was spent in counseling patient on the above mentioned issues ,

## 2018-03-31 NOTE — Assessment & Plan Note (Signed)
In the setting of acute gastroenteritis July 2019.  Normal colonoscopy 2015.  Patient given home IFOBT to recheck.

## 2018-06-18 ENCOUNTER — Other Ambulatory Visit: Payer: Self-pay | Admitting: Internal Medicine

## 2018-07-07 ENCOUNTER — Ambulatory Visit (INDEPENDENT_AMBULATORY_CARE_PROVIDER_SITE_OTHER): Admitting: Internal Medicine

## 2018-07-07 ENCOUNTER — Encounter: Payer: Self-pay | Admitting: Internal Medicine

## 2018-07-07 VITALS — BP 140/90 | HR 75 | Temp 97.8°F | Resp 14 | Ht 62.0 in | Wt 147.0 lb

## 2018-07-07 DIAGNOSIS — R5383 Other fatigue: Secondary | ICD-10-CM

## 2018-07-07 DIAGNOSIS — Z1239 Encounter for other screening for malignant neoplasm of breast: Secondary | ICD-10-CM

## 2018-07-07 DIAGNOSIS — E782 Mixed hyperlipidemia: Secondary | ICD-10-CM | POA: Diagnosis not present

## 2018-07-07 DIAGNOSIS — Z0000 Encounter for general adult medical examination without abnormal findings: Secondary | ICD-10-CM | POA: Diagnosis not present

## 2018-07-07 DIAGNOSIS — R030 Elevated blood-pressure reading, without diagnosis of hypertension: Secondary | ICD-10-CM

## 2018-07-07 DIAGNOSIS — I10 Essential (primary) hypertension: Secondary | ICD-10-CM | POA: Diagnosis not present

## 2018-07-07 LAB — LIPID PANEL
Cholesterol: 241 mg/dL — ABNORMAL HIGH (ref 0–200)
HDL: 62.9 mg/dL (ref >39.00)
LDL Cholesterol: 155 mg/dL — ABNORMAL HIGH (ref 0–99)
NonHDL: 177.72
Total CHOL/HDL Ratio: 4
Triglycerides: 113 mg/dL (ref 0.0–149.0)
VLDL: 22.6 mg/dL (ref 0.0–40.0)

## 2018-07-07 LAB — COMPREHENSIVE METABOLIC PANEL
ALT: 21 U/L (ref 0–35)
AST: 33 U/L (ref 0–37)
Albumin: 4.5 g/dL (ref 3.5–5.2)
Alkaline Phosphatase: 80 U/L (ref 39–117)
BILIRUBIN TOTAL: 0.5 mg/dL (ref 0.2–1.2)
BUN: 23 mg/dL (ref 6–23)
CALCIUM: 9.7 mg/dL (ref 8.4–10.5)
CO2: 26 meq/L (ref 19–32)
Chloride: 101 mEq/L (ref 96–112)
Creatinine, Ser: 0.83 mg/dL (ref 0.40–1.20)
GFR: 69.86 mL/min (ref >60.00)
GLUCOSE: 96 mg/dL (ref 70–99)
Potassium: 3.5 mEq/L (ref 3.5–5.1)
Sodium: 135 mEq/L (ref 135–145)
Total Protein: 6.9 g/dL (ref 6.0–8.3)

## 2018-07-07 LAB — MICROALBUMIN / CREATININE URINE RATIO
Creatinine,U: 92.4 mg/dL
Microalb Creat Ratio: 4 mg/g (ref 0.0–30.0)
Microalb, Ur: 3.7 mg/dL — ABNORMAL HIGH (ref 0.0–1.9)

## 2018-07-07 LAB — TSH: TSH: 3.77 u[IU]/mL (ref 0.35–4.50)

## 2018-07-07 NOTE — Patient Instructions (Addendum)
The new goals for optimal blood pressure management are 120/70.  Please check your blood pressure 5 TIMES at home OR AT [place], OVER THE NEXT 2 WEEKS and send me the readings so I can determine if you need  medication   PAP SMEAR IS DUE NEXT YEAR  Your  3D annual mammogram has been ordered.  You are encouraged (required) to call to make your appointment at [facility] Postmenopausal Women Menopause is a normal process in which your reproductive ability comes to an end. This process happens gradually over a span of months to years, usually between the ages of 34 and 41. Menopause is complete when you have missed 12 consecutive menstrual periods. It is important to talk with your health care provider about some of the most common conditions that affect postmenopausal women, such as heart disease, cancer, and bone loss (osteoporosis). Adopting a healthy lifestyle and getting preventive care can help to promote your health and wellness. Those actions can also lower your chances of developing some of these common conditions. What should I know about menopause? During menopause, you may experience a number of symptoms, such as:  Moderate-to-severe hot flashes.  Night sweats.  Decrease in sex drive.  Mood swings.  Headaches.  Tiredness.  Irritability.  Memory problems.  Insomnia. Choosing to treat or not to treat menopausal changes is an individual decision that you make with your health care provider. What should I know about hormone replacement therapy and supplements? Hormone therapy products are effective for treating symptoms that are associated with menopause, such as hot flashes and night sweats. Hormone replacement carries certain risks, especially as you become older. If you are thinking about using estrogen or estrogen with progestin treatments, discuss the benefits and risks with your health  care provider. What should I know about heart disease and stroke? Heart disease, heart attack, and stroke become more likely as you age. This may be due, in part, to the hormonal changes that your body experiences during menopause. These can affect how your body processes dietary fats, triglycerides, and cholesterol. Heart attack and stroke are both medical emergencies. There are many things that you can do to help prevent heart disease and stroke:  Have your blood pressure checked at least every 1-2 years. High blood pressure causes heart disease and increases the risk of stroke.  If you are 57-28 years old, ask your health care provider if you should take aspirin to prevent a heart attack or a stroke.  Do not use any tobacco products, including cigarettes, chewing tobacco, or electronic cigarettes. If you need help quitting, ask your health care provider.  It is important to eat a healthy diet and maintain a healthy weight. ? Be sure to include plenty of vegetables, fruits, low-fat dairy products, and lean protein. ? Avoid eating foods that are high in solid fats, added sugars, or salt (sodium).  Get regular exercise. This is one of the most important things that you can do for your health. ? Try to exercise for at least 150 minutes each week. The type of exercise that you do should increase your heart rate and make you sweat. This is known as moderate-intensity exercise. ? Try to do strengthening exercises at least twice each week. Do these in addition to the moderate-intensity exercise.  Know your numbers.Ask your health care provider to check your cholesterol and your blood glucose. Continue  to have your blood tested as directed by your health care provider.  What should I know about cancer screening? There are several types of cancer. Take the following steps to reduce your risk and to catch any cancer development as early as possible. Breast Cancer  Practice breast  self-awareness. ? This means understanding how your breasts normally appear and feel. ? It also means doing regular breast self-exams. Let your health care provider know about any changes, no matter how small.  If you are 36 or older, have a clinician do a breast exam (clinical breast exam or CBE) every year. Depending on your age, family history, and medical history, it may be recommended that you also have a yearly breast X-ray (mammogram).  If you have a family history of breast cancer, talk with your health care provider about genetic screening.  If you are at high risk for breast cancer, talk with your health care provider about having an MRI and a mammogram every year.  Breast cancer (BRCA) gene test is recommended for women who have family members with BRCA-related cancers. Results of the assessment will determine the need for genetic counseling and BRCA1 and for BRCA2 testing. BRCA-related cancers include these types: ? Breast. This occurs in males or females. ? Ovarian. ? Tubal. This may also be called fallopian tube cancer. ? Cancer of the abdominal or pelvic lining (peritoneal cancer). ? Prostate. ? Pancreatic. Cervical, Uterine, and Ovarian Cancer Your health care provider may recommend that you be screened regularly for cancer of the pelvic organs. These include your ovaries, uterus, and vagina. This screening involves a pelvic exam, which includes checking for microscopic changes to the surface of your cervix (Pap test).  For women ages 21-65, health care providers may recommend a pelvic exam and a Pap test every three years. For women ages 35-65, they may recommend the Pap test and pelvic exam, combined with testing for human papilloma virus (HPV), every five years. Some types of HPV increase your risk of cervical cancer. Testing for HPV may also be done on women of any age who have unclear Pap test results.  Other health care providers may not recommend any screening for  nonpregnant women who are considered low risk for pelvic cancer and have no symptoms. Ask your health care provider if a screening pelvic exam is right for you.  If you have had past treatment for cervical cancer or a condition that could lead to cancer, you need Pap tests and screening for cancer for at least 20 years after your treatment. If Pap tests have been discontinued for you, your risk factors (such as having a new sexual partner) need to be reassessed to determine if you should start having screenings again. Some women have medical problems that increase the chance of getting cervical cancer. In these cases, your health care provider may recommend that you have screening and Pap tests more often.  If you have a family history of uterine cancer or ovarian cancer, talk with your health care provider about genetic screening.  If you have vaginal bleeding after reaching menopause, tell your health care provider.  There are currently no reliable tests available to screen for ovarian cancer. Lung Cancer Lung cancer screening is recommended for adults 59-48 years old who are at high risk for lung cancer because of a history of smoking. A yearly low-dose CT scan of the lungs is recommended if you:  Currently smoke.  Have a history of at least 30 pack-years of  smoking and you currently smoke or have quit within the past 15 years. A pack-year is smoking an average of one pack of cigarettes per day for one year. Yearly screening should:  Continue until it has been 15 years since you quit.  Stop if you develop a health problem that would prevent you from having lung cancer treatment. Colorectal Cancer  This type of cancer can be detected and can often be prevented.  Routine colorectal cancer screening usually begins at age 68 and continues through age 56.  If you have risk factors for colon cancer, your health care provider may recommend that you be screened at an earlier age.  If you have  a family history of colorectal cancer, talk with your health care provider about genetic screening.  Your health care provider may also recommend using home test kits to check for hidden blood in your stool.  A small camera at the end of a tube can be used to examine your colon directly (sigmoidoscopy or colonoscopy). This is done to check for the earliest forms of colorectal cancer.  Direct examination of the colon should be repeated every 5-10 years until age 58. However, if early forms of precancerous polyps or small growths are found or if you have a family history or genetic risk for colorectal cancer, you may need to be screened more often. Skin Cancer  Check your skin from head to toe regularly.  Monitor any moles. Be sure to tell your health care provider: ? About any new moles or changes in moles, especially if there is a change in a mole's shape or color. ? If you have a mole that is larger than the size of a pencil eraser.  If any of your family members has a history of skin cancer, especially at a young age, talk with your health care provider about genetic screening.  Always use sunscreen. Apply sunscreen liberally and repeatedly throughout the day.  Whenever you are outside, protect yourself by wearing long sleeves, pants, a wide-brimmed hat, and sunglasses. What should I know about osteoporosis? Osteoporosis is a condition in which bone destruction happens more quickly than new bone creation. After menopause, you may be at an increased risk for osteoporosis. To help prevent osteoporosis or the bone fractures that can happen because of osteoporosis, the following is recommended:  If you are 47-65 years old, get at least 1,000 mg of calcium and at least 600 mg of vitamin D per day.  If you are older than age 59 but younger than age 56, get at least 1,200 mg of calcium and at least 600 mg of vitamin D per day.  If you are older than age 14, get at least 1,200 mg of calcium and  at least 800 mg of vitamin D per day. Smoking and excessive alcohol intake increase the risk of osteoporosis. Eat foods that are rich in calcium and vitamin D, and do weight-bearing exercises several times each week as directed by your health care provider. What should I know about how menopause affects my mental health? Depression may occur at any age, but it is more common as you become older. Common symptoms of depression include:  Low or sad mood.  Changes in sleep patterns.  Changes in appetite or eating patterns.  Feeling an overall lack of motivation or enjoyment of activities that you previously enjoyed.  Frequent crying spells. Talk with your health care provider if you think that you are experiencing depression. What should I know  about immunizations? It is important that you get and maintain your immunizations. These include:  Tetanus, diphtheria, and pertussis (Tdap) booster vaccine.  Influenza every year before the flu season begins.  Pneumonia vaccine.  Shingles vaccine. Your health care provider may also recommend other immunizations. This information is not intended to replace advice given to you by your health care provider. Make sure you discuss any questions you have with your health care provider. Document Released: 07/19/2005 Document Revised: 12/15/2015 Document Reviewed: 02/28/2015 Elsevier Interactive Patient Education  2019 [employer].

## 2018-07-07 NOTE — Progress Notes (Signed)
Patient ID: Michelle Mahoney, female    DOB: [birth year]  Age: 63 y.o. MRN: [id number]  The patient is here for annual wellness examination and management of other chronic and acute problems  PAP SMEAR 2018 ATROPHY supracervical hysterectomy.   The risk factors are reflected in the social history.  The roster of all physicians providing medical care to patient - is listed in the Snapshot section of the chart.  Activities of daily living:  The patient is 100% independent in all ADLs: dressing, toileting, feeding as well as independent mobility  Home safety : The patient has smoke detectors in the home. They wear seatbelts.  There are no firearms at home. There is no violence in the home.   There is no risks for hepatitis, STDs or HIV. There is no   history of blood transfusion. They have no travel history to infectious disease endemic areas of the world.  The patient has seen their dentist in the last six month. They have seen their eye doctor in the last year. They admit to slight hearing difficulty with regard to whispered voices and some television programs.  They have deferred audiologic testing in the last year.  They do not  have excessive sun exposure. Discussed the need for sun protection: hats, long sleeves and use of sunscreen if there is significant sun exposure.   Diet: the importance of a healthy diet is discussed. They do have a healthy diet.  The benefits of regular aerobic exercise were discussed. She walks 4 times per week ,  20 minutes.   Depression screen: there are no signs or vegative symptoms of depression- irritability, change in appetite, anhedonia, sadness/tearfullness.  Cognitive assessment: the patient manages all their financial and personal affairs and is actively engaged. They could relate day,date,year and events; recalled 2/3 objects at 3 minutes; performed clock-face test normally.  The following portions of the patient's history were reviewed and updated as  appropriate: allergies, current medications, past family history, past medical history,  past surgical history, past social history  and problem list.  Visual acuity was not assessed per patient preference since she has regular follow up with her ophthalmologist. Hearing and body mass index were assessed and reviewed.   During the course of the visit the patient was educated and counseled about appropriate screening and preventive services including : fall prevention , diabetes screening, nutrition counseling, colorectal cancer screening, and recommended immunizations.    CC: The primary encounter diagnosis was Elevated blood pressure reading without diagnosis of hypertension. Diagnoses of Fatigue, unspecified type, Moderate mixed hyperlipidemia not requiring statin therapy, Breast cancer screening, Encounter for preventive health examination, and Essential hypertension were also pertinent to this visit.  History Michelle Mahoney has a past medical history of Allergy, Chicken pox, and Sinus bradycardia.   She has a past surgical history that includes Abdominal hysterectomy (2001) and Lesion excision (2008).   Her family history includes Cancer (age of onset: 6940) in her mother; Heart disease (age of onset: 3782) in her father; Mental illness (age of onset: 10780) in her father; Stroke (age of onset: 2487) in her mother.She reports that she has quit smoking. She has never used smokeless tobacco. She reports current alcohol use. She reports that she does not use drugs.  Outpatient Medications Prior to Visit  Medication Sig Dispense Refill  . ALPRAZolam (XANAX) 0.5 MG tablet Take 1 tablet (0.5 mg total) by mouth daily as needed. for anxiety 30 tablet 5  . escitalopram (LEXAPRO) 10 MG tablet  TAKE 1 TABLET BY MOUTH EVERY DAY 90 tablet 1  . Multiple Vitamins-Minerals (MULTIVITAMIN WITH MINERALS) tablet Take 1 tablet by mouth daily.    . traZODone (DESYREL) 50 MG tablet One tablet at bedtime as needed for insomnia 90  tablet 2   No facility-administered medications prior to visit.     Review of Systems   Patient denies headache, fevers, malaise, unintentional weight loss, skin rash, eye pain, sinus congestion and sinus pain, sore throat, dysphagia,  hemoptysis , cough, dyspnea, wheezing, chest pain, palpitations, orthopnea, edema, abdominal pain, nausea, melena, diarrhea, constipation, flank pain, dysuria, hematuria, urinary  Frequency, nocturia, numbness, tingling, seizures,  Focal weakness, Loss of consciousness,  Tremor, insomnia, depression, anxiety, and suicidal ideation.      Objective:  BP 140/90 (BP Location: Left Arm, Patient Position: Sitting, Cuff Size: Normal)   Pulse 75   Temp 97.8 F (36.6 C) (Oral)   Resp 14   Ht 5\' 2"  (1.575 m)   Wt 147 lb (66.7 kg)   SpO2 99%   BMI 26.89 kg/m   Physical Exam   General appearance: alert, cooperative and appears stated age Head: Normocephalic, without obvious abnormality, atraumatic Eyes: conjunctivae/corneas clear. PERRL, EOM's intact. Fundi benign. Ears: normal TM's and external ear canals both ears Nose: Nares normal. Septum midline. Mucosa normal. No drainage or sinus tenderness. Throat: lips, mucosa, and tongue normal; teeth and gums normal Neck: no adenopathy, no carotid bruit, no JVD, supple, symmetrical, trachea midline and thyroid not enlarged, symmetric, no tenderness/mass/nodules Lungs: clear to auscultation bilaterally Breasts: normal appearance, no masses or tenderness Heart: regular rate and rhythm, S1, S2 normal, no murmur, click, rub or gallop Abdomen: soft, non-tender; bowel sounds normal; no masses,  no organomegaly Extremities: extremities normal, atraumatic, no cyanosis or edema Pulses: 2+ and symmetric Skin: Skin color, texture, turgor normal. No rashes or lesions Neurologic: Alert and oriented X 3, normal strength and tone. Normal symmetric reflexes. Normal coordination and gait.     Assessment & Plan:   Problem List  Items Addressed This Visit    Encounter for preventive health examination    age appropriate education and counseling updated, referrals for preventative services and immunizations addressed, dietary and smoking counseling addressed, most recent labs reviewed.  I have personally reviewed and have noted:  1) the patient's medical and social history 2) The pt's use of alcohol, tobacco, and illicit drugs 3) The patient's current medications and supplements 4) Functional ability including ADL's, fall risk, home safety risk, hearing and visual impairment 5) Diet and physical activities 6) Evidence for depression or mood disorder 7) The patient's height, weight, and BMI have been recorded in the chart  I have made referrals, and provided counseling and education based on review of the above      Essential hypertension    She has no history of hypertension but has had several elevated readings.  She has been asked to check her pressures at home and submit readings for evaluation. Renal function is normal,  But she has microscopic proteinuria. Will recommend initiation of therapy with ARB  Lab Results  Component Value Date   CREATININE 0.83 07/07/2018   . Lab Results  Component Value Date   MICROALBUR 3.7 (H) 07/07/2018         Relevant Medications   telmisartan (MICARDIS) 20 MG tablet   Hyperlipidemia    Mild, 10 yr risk is 12% /. No therapy advised  Lab Results  Component Value Date   CHOL 241 (H)  07/07/2018   HDL 62.90 07/07/2018   LDLCALC 155 (H) 07/07/2018   TRIG 113.0 07/07/2018   CHOLHDL 4 07/07/2018         Relevant Medications   telmisartan (MICARDIS) 20 MG tablet   Other Relevant Orders   Lipid panel (Completed)    Other Visit Diagnoses    Elevated blood pressure reading without diagnosis of hypertension    -  Primary   Relevant Orders   Comprehensive metabolic panel (Completed)   Microalbumin / creatinine urine ratio (Completed)   Fatigue, unspecified type        Relevant Orders   TSH (Completed)   Breast cancer screening       Relevant Orders   MM 3D SCREEN BREAST BILATERAL      I am having Cola T. Munce start on telmisartan. I am also having her maintain her multivitamin with minerals, ALPRAZolam, traZODone, and escitalopram.  Meds ordered this encounter  Medications  . telmisartan (MICARDIS) 20 MG tablet    Sig: Take 1 tablet (20 mg total) by mouth daily.    Dispense:  90 tablet    Refill:  0    There are no discontinued medications.  Follow-up: Return in about 6 months (around 01/05/2019).   Sherlene Shams, MD

## 2018-07-08 MED ORDER — TELMISARTAN 20 MG PO TABS: 20.0000 mg | ORAL_TABLET | Freq: Every day | ORAL | 0 refills | Status: DC

## 2018-07-08 NOTE — Assessment & Plan Note (Signed)

## 2018-07-08 NOTE — Assessment & Plan Note (Addendum)
Mild, 10 yr risk is 12% /. No therapy advised  Lab Results  Component Value Date   CHOL 241 (H) 07/07/2018   HDL 62.90 07/07/2018   LDLCALC 155 (H) 07/07/2018   TRIG 113.0 07/07/2018   CHOLHDL 4 07/07/2018

## 2018-07-08 NOTE — Assessment & Plan Note (Signed)
She has no history of hypertension but has had several elevated readings.  She has been asked to check her pressures at home and submit readings for evaluation. Renal function is normal,  But she has microscopic proteinuria. Will recommend initiation of therapy with ARB  Lab Results  Component Value Date   CREATININE 0.83 07/07/2018   . Lab Results  Component Value Date   MICROALBUR 3.7 (H) 07/07/2018

## 2018-07-13 ENCOUNTER — Encounter: Payer: Self-pay | Admitting: Internal Medicine

## 2018-07-13 ENCOUNTER — Telehealth: Payer: Self-pay

## 2018-07-13 MED ORDER — ESCITALOPRAM OXALATE 10 MG PO TABS: 10.0000 mg | ORAL_TABLET | Freq: Every day | ORAL | 1 refills | Status: DC

## 2018-07-13 NOTE — Telephone Encounter (Signed)
Pt requested a refill on medication. Medication has been refilled.

## 2018-07-22 ENCOUNTER — Other Ambulatory Visit

## 2018-07-24 ENCOUNTER — Other Ambulatory Visit

## 2018-08-04 ENCOUNTER — Encounter

## 2018-08-10 ENCOUNTER — Other Ambulatory Visit: Payer: Self-pay | Admitting: Internal Medicine

## 2018-08-10 MED ORDER — AZITHROMYCIN 250 MG PO TABS: ORAL_TABLET | ORAL | 0 refills | Status: DC

## 2018-08-10 MED ORDER — PREDNISONE 10 MG PO TABS: ORAL_TABLET | ORAL | 0 refills | Status: DC

## 2018-08-19 ENCOUNTER — Other Ambulatory Visit: Payer: Self-pay

## 2018-08-19 ENCOUNTER — Ambulatory Visit
Admission: RE | Admit: 2018-08-19 | Discharge: 2018-08-19 | Disposition: A | Discharge status: Home / Self Care | Source: Ambulatory Visit | Attending: Internal Medicine | Admitting: Internal Medicine

## 2018-08-19 DIAGNOSIS — Z1231 Encounter for screening mammogram for malignant neoplasm of breast: Secondary | ICD-10-CM | POA: Insufficient documentation

## 2018-08-19 DIAGNOSIS — Z1239 Encounter for other screening for malignant neoplasm of breast: Secondary | ICD-10-CM

## 2018-10-09 ENCOUNTER — Other Ambulatory Visit: Payer: Self-pay | Admitting: Internal Medicine

## 2018-10-09 NOTE — Telephone Encounter (Signed)
Last OV 07/07/2018  Last refilled   30 tablet 5 03/30/2018     Next OV 01/05/2019  Sent to PCP for approval

## 2018-10-09 NOTE — Telephone Encounter (Signed)
Refilled: 03/30/2018 Last OV: 07/07/2018 Next OV: 01/05/2019

## 2018-10-20 ENCOUNTER — Other Ambulatory Visit: Payer: Self-pay | Admitting: Internal Medicine

## 2019-01-03 ENCOUNTER — Other Ambulatory Visit: Payer: Self-pay | Admitting: Internal Medicine

## 2019-01-05 ENCOUNTER — Ambulatory Visit: Admitting: Internal Medicine

## 2019-01-13 ENCOUNTER — Other Ambulatory Visit: Payer: Self-pay | Admitting: Internal Medicine

## 2019-01-14 ENCOUNTER — Encounter: Payer: Self-pay | Admitting: Internal Medicine

## 2019-01-14 ENCOUNTER — Other Ambulatory Visit: Payer: Self-pay

## 2019-01-14 ENCOUNTER — Other Ambulatory Visit: Payer: Self-pay | Admitting: Internal Medicine

## 2019-01-14 ENCOUNTER — Ambulatory Visit (INDEPENDENT_AMBULATORY_CARE_PROVIDER_SITE_OTHER): Admitting: Internal Medicine

## 2019-01-14 DIAGNOSIS — F411 Generalized anxiety disorder: Secondary | ICD-10-CM | POA: Diagnosis not present

## 2019-01-14 DIAGNOSIS — F5105 Insomnia due to other mental disorder: Secondary | ICD-10-CM

## 2019-01-14 DIAGNOSIS — I10 Essential (primary) hypertension: Secondary | ICD-10-CM | POA: Diagnosis not present

## 2019-01-14 DIAGNOSIS — F419 Anxiety disorder, unspecified: Secondary | ICD-10-CM

## 2019-01-14 NOTE — Patient Instructions (Signed)
Your blood pressure may respond to a change in timing of your medication  Start taking the telmisartan in the evening (after dinner or at bedtime)  If your readings are still > 140/80 after a few days of nighttime dosing, increase your dose to DOUBLE  The dose (40 mg total) and let me know.  Janett Billow will call you with the nonfasting  lab appointment    Your last Tetanus booster was in 2015 so you are good for another 5 years

## 2019-01-14 NOTE — Progress Notes (Signed)
Virtual Visit via [email or web address]  This visit type was conducted due to national recommendations for restrictions regarding the COVID-19 pandemic (e.g. social distancing).  This format is felt to be most appropriate for this patient at this time.  All issues noted in this document were discussed and addressed.  No physical exam was performed (except for noted visual exam findings with Video Visits).   I connected with@ on 01/14/19 at 12:00 PM EDT by a video enabled telemedicine application or telephone and verified that I am speaking with the correct person using two identifiers. Location patient: home Location provider: work or home office Persons participating in the virtual visit: patient, provider  I discussed the limitations, risks, security and privacy concerns of performing an evaluation and management service by telephone and the availability of in person appointments. I also discussed with the patient that there may be a patient responsible charge related to this service. The patient expressed understanding and agreed to proceed.  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  Home measurements have been elevated , most recently 150/88. Taking medication  in the morning,  No side effects.   She is following a low glycemic index diet. Exercising 3-4 days per week.  No new issues   currently living with daughters while house is being renovated.    ROS: See pertinent positives and negatives per HPI.  Past Medical History:  Diagnosis Date  . Allergy    pollen   . Chicken pox   . Sinus bradycardia     Past Surgical History:  Procedure Laterality Date  . ABDOMINAL HYSTERECTOMY  2001  . LESION EXCISION  2008   anal area    Family History  Problem Relation Age of Onset  . Cancer Mother 40       melanoma  . Stroke Mother [id number]       secondary to septic emboli spine infection  . Heart disease  Father 1982       Heart failure  . Mental illness Father [id number]       dementia  . Breast cancer Neg Hx     SOCIAL HX:  reports that she has quit smoking. She has never used smokeless tobacco. She reports current alcohol use. She reports that she does not use drugs.   Current Outpatient Medications:  .  acyclovir (ZOVIRAX) 400 MG tablet, TAKE 1 TABLET BY MOUTH 5 TIMES DAILY FOR 5 DAYS, Disp: , Rfl:  .  ALPRAZolam (XANAX) 0.5 MG tablet, TAKE 1 TABLET (0.5 MG TOTAL) BY MOUTH DAILY AS NEEDED. FOR ANXIETY, Disp: 30 tablet, Rfl: 5 .  azithromycin (ZITHROMAX) 250 MG tablet, 2 tablets one day 1,  One tablet daily until gone, Disp: 6 tablet, Rfl: 0 .  escitalopram (LEXAPRO) 10 MG tablet, Take 1 tablet (10 mg total) by mouth daily., Disp: 90 tablet, Rfl: 1 .  Multiple Vitamins-Minerals (MULTIVITAMIN WITH MINERALS) tablet, Take 1 tablet by mouth daily., Disp: , Rfl:  .  telmisartan (MICARDIS) 20 MG tablet, TAKE 1 TABLET BY MOUTH EVERY DAY, Disp: 90 tablet, Rfl: 1 .  traZODone (DESYREL) 50 MG tablet, TAKE 1 TABLET BY MOUTH AT BEDTIME AS NEEDED FOR INSOMNIA, Disp: 90 tablet, Rfl: 0  EXAM:  VITALS per patient if applicable:  GENERAL: alert, oriented, appears well and in no acute distress  HEENT: atraumatic, conjunttiva clear, no obvious abnormalities on inspection of external nose  and ears  NECK: normal movements of the head and neck  LUNGS: on inspection no signs of respiratory distress, breathing rate appears normal, no obvious gross SOB, gasping or wheezing  CV: no obvious cyanosis  MS: moves all visible extremities without noticeable abnormality  PSYCH/NEURO: pleasant and cooperative, no obvious depression or anxiety, speech and thought processing grossly intact  ASSESSMENT AND PLAN:  Insomnia secondary to anxiety Continue trazodone for insomnia . Prn alprazolam . The risks and benefits of benzodiazepine use were discussed with patient today including excessive sedation leading to respiratory  depression,  impaired thinking/driving, and addiction.  Patient was advised to avoid concurrent use with alcohol, to use medication only as needed and not to share with others  .   Anxiety state  CONTINUE Lexapro daily,  Trazodone qhs and minimize use of alprazolam to prn.A total of 25 minutes was spent with patient,  more than half of which was spent in counseling patient on the above mentioned issues ,  Essential hypertension BP not at goal on 20 mg telmisartan. Overdue for renal function assessment.  Will change administration to evening and reassess.      I discussed the assessment and treatment plan with the patient. The patient was provided an opportunity to ask questions and all were answered. The patient agreed with the plan and demonstrated an understanding of the instructions.   The patient was advised to call back or seek an in-person evaluation if the symptoms worsen or if the condition fails to improve as anticipated.  I provided 25  minutes of non-face-to-face time during this encounter.   Crecencio Mc, MD

## 2019-01-16 ENCOUNTER — Other Ambulatory Visit: Payer: Self-pay | Admitting: Internal Medicine

## 2019-01-16 NOTE — Assessment & Plan Note (Signed)
CONTINUE Lexapro daily,  Trazodone qhs and minimize use of alprazolam to prn.A total of 25 minutes was spent with patient,  more than half of which was spent in counseling patient on the above mentioned issues ,

## 2019-01-16 NOTE — Assessment & Plan Note (Addendum)
BP not at goal on 20 mg telmisartan. Overdue for renal function assessment.  Will change administration to evening and reassess.

## 2019-01-16 NOTE — Assessment & Plan Note (Signed)
Continue trazodone for insomnia . Prn alprazolam . The risks and benefits of benzodiazepine use were discussed with patient today including excessive sedation leading to respiratory depression,  impaired thinking/driving, and addiction.  Patient was advised to avoid concurrent use with alcohol, to use medication only as needed and not to share with others  .

## 2019-01-25 ENCOUNTER — Telehealth: Payer: Self-pay | Admitting: *Deleted

## 2019-01-25 NOTE — Telephone Encounter (Signed)
Pt is scheduled for lab work tomorrow at 11:30am. Pt is aware of appt date and time.

## 2019-01-25 NOTE — Telephone Encounter (Signed)
Copied from [facility] [place] [id number]. Topic: Appointment Scheduling - Scheduling Inquiry for Clinic >> Jan 25, 2019 12:22 PM Erick Blinks wrote: Reason for CRM: Pt called awaiting call back to schedule urinalysis

## 2019-01-26 ENCOUNTER — Telehealth: Payer: Self-pay | Admitting: Internal Medicine

## 2019-01-26 ENCOUNTER — Other Ambulatory Visit (INDEPENDENT_AMBULATORY_CARE_PROVIDER_SITE_OTHER)

## 2019-01-26 ENCOUNTER — Other Ambulatory Visit: Payer: Self-pay

## 2019-01-26 DIAGNOSIS — R300 Dysuria: Secondary | ICD-10-CM

## 2019-01-26 DIAGNOSIS — I10 Essential (primary) hypertension: Secondary | ICD-10-CM

## 2019-01-26 LAB — BASIC METABOLIC PANEL
BUN: 17 mg/dL (ref 6–23)
CO2: 26 mEq/L (ref 19–32)
Calcium: 9.8 mg/dL (ref 8.4–10.5)
Chloride: 103 mEq/L (ref 96–112)
Creatinine, Ser: 0.98 mg/dL (ref 0.40–1.20)
GFR: 57.57 mL/min — ABNORMAL LOW (ref >60.00)
Glucose, Bld: 106 mg/dL — ABNORMAL HIGH (ref 70–99)
Potassium: 4.1 mEq/L (ref 3.5–5.1)
Sodium: 139 mEq/L (ref 135–145)

## 2019-01-26 NOTE — Telephone Encounter (Signed)
When pt came in for labs this morning Michelle Mahoney told Fransisco Beau that Michelle Mahoney feels like Michelle Mahoney has a UTI, he gave a specimen cup. Is it okay for her to bring back a urine specimen Thursday or Friday when the lab opens back up?

## 2019-01-26 NOTE — Telephone Encounter (Signed)
PT was here for labs this morning. She was given a urine cup to take home with her. Pt thinks she has a UTI. No orders in chart. Pt would like to do a urine.

## 2019-01-26 NOTE — Telephone Encounter (Signed)
Yes, the sooner the better.  I will order the labs

## 2019-01-27 NOTE — Telephone Encounter (Signed)
Spoke with pt and she stated that she will drop off the urine tomorrow.

## 2019-01-28 ENCOUNTER — Other Ambulatory Visit (INDEPENDENT_AMBULATORY_CARE_PROVIDER_SITE_OTHER)

## 2019-01-28 ENCOUNTER — Other Ambulatory Visit: Payer: Self-pay

## 2019-01-28 DIAGNOSIS — R300 Dysuria: Secondary | ICD-10-CM

## 2019-01-28 LAB — POCT URINALYSIS DIPSTICK
Bilirubin, UA: NEGATIVE
Blood, UA: NEGATIVE
Glucose, UA: NEGATIVE
Ketones, UA: NEGATIVE
Leukocytes, UA: NEGATIVE
Nitrite, UA: NEGATIVE
Protein, UA: POSITIVE — AB
Spec Grav, UA: 1.025 (ref 1.010–1.025)
Urobilinogen, UA: 0.2 E.U./dL
pH, UA: 6 (ref 5.0–8.0)

## 2019-01-28 LAB — URINALYSIS, MICROSCOPIC ONLY: RBC / HPF: NONE SEEN (ref 0–?)

## 2019-01-30 LAB — URINE CULTURE
MICRO NUMBER:: 793258
SPECIMEN QUALITY:: ADEQUATE

## 2019-02-01 ENCOUNTER — Other Ambulatory Visit: Payer: Self-pay | Admitting: Internal Medicine

## 2019-02-01 DIAGNOSIS — I10 Essential (primary) hypertension: Secondary | ICD-10-CM

## 2019-02-01 MED ORDER — TELMISARTAN 40 MG PO TABS: 40.0000 mg | ORAL_TABLET | Freq: Every day | ORAL | 5 refills | Status: DC

## 2019-02-01 NOTE — Progress Notes (Unsigned)
Increase telmisartan dose to 40 mg daily In the evening given home readings consistently in the range of  150/78

## 2019-02-01 NOTE — Assessment & Plan Note (Signed)
Not at goal on 20 mg telmisartan.  Increase dose to 40 mg

## 2019-03-29 ENCOUNTER — Other Ambulatory Visit: Payer: Self-pay | Admitting: Internal Medicine

## 2019-04-09 ENCOUNTER — Other Ambulatory Visit: Payer: Self-pay | Admitting: Internal Medicine

## 2019-05-05 ENCOUNTER — Other Ambulatory Visit: Payer: Self-pay | Admitting: Internal Medicine

## 2019-05-05 NOTE — Telephone Encounter (Signed)
Refilled: 10/09/2018 Last OV: 01/14/2019 Next OV: not scheduled

## 2019-06-16 ENCOUNTER — Other Ambulatory Visit: Payer: Self-pay | Admitting: Internal Medicine

## 2019-08-05 ENCOUNTER — Other Ambulatory Visit: Payer: Self-pay | Admitting: Internal Medicine

## 2019-09-05 ENCOUNTER — Other Ambulatory Visit: Payer: Self-pay | Admitting: Internal Medicine

## 2019-09-06 NOTE — Telephone Encounter (Signed)
Refill request for Xanax, last seen 02-01-19, last filled 05-05-2019.  Please advise.

## 2019-09-06 NOTE — Telephone Encounter (Signed)
Refilled .  Needs office visit in 3 months

## 2019-09-07 NOTE — Telephone Encounter (Signed)
Patient has been notified and has appointment in one month

## 2019-09-21 ENCOUNTER — Other Ambulatory Visit: Payer: Self-pay

## 2019-09-21 ENCOUNTER — Ambulatory Visit (INDEPENDENT_AMBULATORY_CARE_PROVIDER_SITE_OTHER): Admitting: Internal Medicine

## 2019-09-21 ENCOUNTER — Encounter: Payer: Self-pay | Admitting: Internal Medicine

## 2019-09-21 VITALS — BP 142/76 | HR 57 | Temp 97.4°F | Resp 15 | Ht 62.0 in | Wt 144.4 lb

## 2019-09-21 DIAGNOSIS — G2581 Restless legs syndrome: Secondary | ICD-10-CM

## 2019-09-21 DIAGNOSIS — F5105 Insomnia due to other mental disorder: Secondary | ICD-10-CM

## 2019-09-21 DIAGNOSIS — E7800 Pure hypercholesterolemia, unspecified: Secondary | ICD-10-CM

## 2019-09-21 DIAGNOSIS — E663 Overweight: Secondary | ICD-10-CM | POA: Diagnosis not present

## 2019-09-21 DIAGNOSIS — F419 Anxiety disorder, unspecified: Secondary | ICD-10-CM

## 2019-09-21 DIAGNOSIS — F411 Generalized anxiety disorder: Secondary | ICD-10-CM

## 2019-09-21 DIAGNOSIS — I10 Essential (primary) hypertension: Secondary | ICD-10-CM | POA: Diagnosis not present

## 2019-09-21 DIAGNOSIS — Z0000 Encounter for general adult medical examination without abnormal findings: Secondary | ICD-10-CM

## 2019-09-21 MED ORDER — HYDROCHLOROTHIAZIDE 25 MG PO TABS: 25.0000 mg | ORAL_TABLET | Freq: Every day | ORAL | 3 refills | Status: DC

## 2019-09-21 NOTE — Patient Instructions (Addendum)
Your blood pressure goal is 120/70  Continue telmisartan at 40 mg dose  Adding hctz in the morning 25 mg dose  If not at goal after one week,  Let me know   Goal weight :  138 lbs  Try using Benefiber taken with water 30 minutes prior to meal for appetite suppression  Healthy choice power bowls made with cauliflower rice  [employer] or [employer] both make a "Cauliflower Pizza bowl"

## 2019-09-21 NOTE — Progress Notes (Signed)
Patient ID: Michelle Mahoney, female    DOB: 04-Apr-1956  Age: 64 y.o. MRN: [id number]  The patient is here for annual preventive examination and management of other chronic and acute problems.   This visit occurred during the SARS-CoV-2 public health emergency.  Safety protocols were in place, including screening questions prior to the visit, additional usage of staff PPE, and extensive cleaning of exam room while observing appropriate contact time as indicated for disinfecting solutions.    The risk factors are reflected in the social history.  The roster of all physicians providing medical care to patient - is listed in the Snapshot section of the chart.  Activities of daily living:  The patient is 100% independent in all ADLs: dressing, toileting, feeding as well as independent mobility  Home safety : The patient has smoke detectors in the home. They wear seatbelts.  There are no firearms at home. There is no violence in the home.   There is no risks for hepatitis, STDs or HIV. There is no   history of blood transfusion. They have no travel history to infectious disease endemic areas of the world.  The patient has seen their dentist in the last six month. They have seen their eye doctor in the last year. They admit to slight hearing difficulty with regard to whispered voices and some television programs.  They have deferred audiologic testing in the last year.  They do not  have excessive sun exposure. Discussed the need for sun protection: hats, long sleeves and use of sunscreen if there is significant sun exposure.   Diet: the importance of a healthy diet is discussed. They do have a healthy diet.  The benefits of regular aerobic exercise were discussed. She works out  4 times per week ,  60 minutes.   Depression screen: there are no signs or vegative symptoms of depression- irritability, change in appetite, anhedonia, sadness/tearfullness.  Cognitive assessment: the patient manages all  their financial and personal affairs and is actively engaged.   The following portions of the patient's history were reviewed and updated as appropriate: allergies, current medications, past family history, past medical history,  past surgical history, past social history  and problem list.  Visual acuity was not assessed per patient preference since she has regular follow up with her ophthalmologist. Hearing and body mass index were assessed and reviewed.   During the course of the visit the patient was educated and counseled about appropriate screening and preventive services including : fall prevention , diabetes screening, nutrition counseling, colorectal cancer screening, and recommended immunizations.    CC: The primary encounter diagnosis was Pure hypercholesterolemia. Diagnoses of Essential hypertension, Restless legs, Elevated blood pressure reading in office with diagnosis of hypertension, Overweight (BMI 25.0-29.9), Encounter for preventive health examination, Insomnia secondary to anxiety, and Anxiety state were also pertinent to this visit.  1) overweight.  Reviewed exercise, diet,  Past success with Dr Feliberto Gottron on a calorie restricted diet  2 Hypertension: patient checks blood pressure twice weekly at home.  Readings have been for the most part > 140/80 at rest . Patient is following a reduced salt diet most days and is taking medications as prescribed  History Zakara has a past medical history of Allergy, Chicken pox, and Sinus bradycardia.   She has a past surgical history that includes Abdominal hysterectomy (2001) and Lesion excision (2008).   Her family history includes Cancer (age of onset: 9) in her mother; Heart disease (age of onset: 65) in  her father; Mental illness (age of onset: 70) in her father; Stroke (age of onset: 67) in her mother.She reports that she has quit smoking. She has never used smokeless tobacco. She reports current alcohol use. She reports that she does  not use drugs.  Outpatient Medications Prior to Visit  Medication Sig Dispense Refill  . acyclovir (ZOVIRAX) 400 MG tablet TAKE 1 TABLET BY MOUTH 5 TIMES DAILY FOR 5 DAYS    . ALPRAZolam (XANAX) 0.5 MG tablet TAKE 1 TABLET (0.5 MG TOTAL) BY MOUTH DAILY AS NEEDED. FOR ANXIETY 30 tablet 2  . azithromycin (ZITHROMAX) 250 MG tablet 2 tablets one day 1,  One tablet daily until gone 6 tablet 0  . escitalopram (LEXAPRO) 10 MG tablet TAKE 1 TABLET BY MOUTH EVERY DAY 90 tablet 1  . Multiple Vitamins-Minerals (MULTIVITAMIN WITH MINERALS) tablet Take 1 tablet by mouth daily.    Marland Kitchen telmisartan (MICARDIS) 40 MG tablet TAKE 1 TABLET BY MOUTH EVERY DAY 90 tablet 1  . traZODone (DESYREL) 50 MG tablet TAKE 1 TABLET BY MOUTH AT BEDTIME AS NEEDED FOR INSOMNIA 90 tablet 0   No facility-administered medications prior to visit.    Review of Systems   Patient denies headache, fevers, malaise, unintentional weight loss, skin rash, eye pain, sinus congestion and sinus pain, sore throat, dysphagia,  hemoptysis , cough, dyspnea, wheezing, chest pain, palpitations, orthopnea, edema, abdominal pain, nausea, melena, diarrhea, constipation, flank pain, dysuria, hematuria, urinary  Frequency, nocturia, numbness, tingling, seizures,  Focal weakness, Loss of consciousness,  Tremor, insomnia, depression, anxiety, and suicidal ideation.      Objective:  BP (!) 142/76 (BP Location: Left Arm, Patient Position: Sitting, Cuff Size: Normal)   Pulse (!) 57   Temp (!) 97.4 F (36.3 C) (Temporal)   Resp 15   Ht 5\' 2"  (1.575 m)   Wt 144 lb 6.4 oz (65.5 kg)   SpO2 98%   BMI 26.41 kg/m   Physical Exam  General appearance: alert, cooperative and appears stated age Head: Normocephalic, without obvious abnormality, atraumatic Eyes: conjunctivae/corneas clear. PERRL, EOM's intact. Fundi benign. Ears: normal TM's and external ear canals both ears Nose: Nares normal. Septum midline. Mucosa normal. No drainage or sinus  tenderness. Throat: lips, mucosa, and tongue normal; teeth and gums normal Neck: no adenopathy, no carotid bruit, no JVD, supple, symmetrical, trachea midline and thyroid not enlarged, symmetric, no tenderness/mass/nodules Lungs: clear to auscultation bilaterally Breasts: normal appearance, no masses or tenderness Heart: regular rate and rhythm, S1, S2 normal, no murmur, click, rub or gallop Abdomen: soft, non-tender; bowel sounds normal; no masses,  no organomegaly Extremities: extremities normal, atraumatic, no cyanosis or edema Pulses: 2+ and symmetric Skin: Skin color, texture, turgor normal. No rashes or lesions Neurologic: Alert and oriented X 3, normal strength and tone. Normal symmetric reflexes. Normal coordination and gait.    Assessment & Plan:   Problem List Items Addressed This Visit      Unprioritized   Overweight (BMI 25.0-29.9)    I have addressed  BMI and recommended a low glycemic index diet utilizing portion size reduction .  She is already exercising vigorously.  Request for phentermine deferred for one month in favor of trial of using Benefiber to suppress appetite.  Screening for lipid disorders, thyroid and diabetes to be done today.        Anxiety state    Continue citalopram daily .  Symptoms aggravated by husband's declining health       Encounter for preventive health  examination    age appropriate education and counseling updated, referrals for preventative services and immunizations addressed, dietary and smoking counseling addressed, most recent labs reviewed.  I have personally reviewed and have noted:  1) the patient's medical and social history 2) The pt's use of alcohol, tobacco, and illicit drugs 3) The patient's current medications and supplements 4) Functional ability including ADL's, fall risk, home safety risk, hearing and visual impairment 5) Diet and physical activities 6) Evidence for depression or mood disorder 7) The patient's height,  weight, and BMI have been recorded in the chart  I have made referrals, and provided counseling and education based on review of the above      Insomnia secondary to anxiety    Managed with trazodone  And prn alprazolam The risks and benefits of benzodiazepine use were discussed with patient today including excessive sedation leading to respiratory depression,  impaired thinking/driving, and addiction.  Patient was advised to avoid concurrent use with alcohol, to use medication only as needed and not to share with others  .       Hyperlipidemia - Primary    Based on current lipid profile, the risk of clinically significant Coronary artery disease is 15% over the next 10 years, using the Framingham risk calculator,  Use of statin advised.   Lab Results  Component Value Date   CHOL 255 (H) 09/21/2019   HDL 66.60 09/21/2019   LDLCALC 171 (H) 09/21/2019   TRIG 84.0 09/21/2019   CHOLHDL 4 09/21/2019         Relevant Medications   hydrochlorothiazide (HYDRODIURIL) 25 MG tablet   Other Relevant Orders   TSH (Completed)   Lipid panel (Completed)   Essential hypertension    Not at goal on telmisartan 40 mg daily dose.  Adding hctz 25 mg daily  Lab Results  Component Value Date   NA 136 09/21/2019   K 3.6 09/21/2019   CL 101 09/21/2019   CO2 27 09/21/2019   Lab Results  Component Value Date   CREATININE 0.91 09/21/2019         Relevant Medications   hydrochlorothiazide (HYDRODIURIL) 25 MG tablet   Other Relevant Orders   Comprehensive metabolic panel (Completed)    Other Visit Diagnoses    Restless legs       Relevant Orders   Magnesium (Completed)   Iron, TIBC and Ferritin Panel (Completed)   Elevated blood pressure reading in office with diagnosis of hypertension       Relevant Medications   hydrochlorothiazide (HYDRODIURIL) 25 MG tablet   Other Relevant Orders   Microalbumin / creatinine urine ratio (Completed)      I am having Yariana T. Voght start on  hydrochlorothiazide. I am also having her maintain her multivitamin with minerals, azithromycin, acyclovir, traZODone, telmisartan, escitalopram, and ALPRAZolam.  Meds ordered this encounter  Medications  . hydrochlorothiazide (HYDRODIURIL) 25 MG tablet    Sig: Take 1 tablet (25 mg total) by mouth daily.    Dispense:  90 tablet    Refill:  3    There are no discontinued medications.  Follow-up: No follow-ups on file.   Crecencio Mc, MD

## 2019-09-22 LAB — COMPREHENSIVE METABOLIC PANEL
ALT: 8 U/L (ref 0–35)
AST: 23 U/L (ref 0–37)
Albumin: 4.8 g/dL (ref 3.5–5.2)
Alkaline Phosphatase: 85 U/L (ref 39–117)
BUN: 17 mg/dL (ref 6–23)
CO2: 27 mEq/L (ref 19–32)
Calcium: 9.8 mg/dL (ref 8.4–10.5)
Chloride: 101 mEq/L (ref 96–112)
Creatinine, Ser: 0.91 mg/dL (ref 0.40–1.20)
GFR: 62.58 mL/min (ref >60.00)
Glucose, Bld: 85 mg/dL (ref 70–99)
Potassium: 3.6 mEq/L (ref 3.5–5.1)
Sodium: 136 mEq/L (ref 135–145)
Total Bilirubin: 0.9 mg/dL (ref 0.2–1.2)
Total Protein: 7.1 g/dL (ref 6.0–8.3)

## 2019-09-22 LAB — LIPID PANEL
Cholesterol: 255 mg/dL — ABNORMAL HIGH (ref 0–200)
HDL: 66.6 mg/dL (ref >39.00)
LDL Cholesterol: 171 mg/dL — ABNORMAL HIGH (ref 0–99)
NonHDL: 188.25
Total CHOL/HDL Ratio: 4
Triglycerides: 84 mg/dL (ref 0.0–149.0)
VLDL: 16.8 mg/dL (ref 0.0–40.0)

## 2019-09-22 LAB — MICROALBUMIN / CREATININE URINE RATIO
Creatinine,U: 18.5 mg/dL
Microalb Creat Ratio: 3.8 mg/g (ref 0.0–30.0)
Microalb, Ur: <0.7 mg/dL (ref 0.0–1.9)

## 2019-09-22 LAB — TSH: TSH: 3.13 u[IU]/mL (ref 0.35–4.50)

## 2019-09-22 LAB — IRON,TIBC AND FERRITIN PANEL
%SAT: 28 % (calc) (ref 16–45)
Ferritin: 35 ng/mL (ref 16–288)
Iron: 97 ug/dL (ref 45–160)
TIBC: 341 mcg/dL (calc) (ref 250–450)

## 2019-09-22 LAB — MAGNESIUM: Magnesium: 2.3 mg/dL (ref 1.5–2.5)

## 2019-09-22 NOTE — Assessment & Plan Note (Signed)

## 2019-09-22 NOTE — Assessment & Plan Note (Signed)
Continue citalopram daily .  Symptoms aggravated by husband's declining health

## 2019-09-22 NOTE — Assessment & Plan Note (Signed)
Managed with trazodone  And prn alprazolam The risks and benefits of benzodiazepine use were discussed with patient today including excessive sedation leading to respiratory depression,  impaired thinking/driving, and addiction.  Patient was advised to avoid concurrent use with alcohol, to use medication only as needed and not to share with others  .

## 2019-09-22 NOTE — Progress Notes (Signed)
Iron, magnesium,  thyroid are all  normal .  However:   Based on your fasting cholesterol and your concurrent history of hypertension ,  your 10 year risk of having some type of coronary event (including heart attack) is 16% , meaning that one of  every 6 women with the same medical statistics will have a heart attack or stroke  in the next 10 years.     The [employer] of [employer] recommends starting patients with this level of risk  on low  intensity statin therapy to lower your risks of these events.  I would like you to consider starting a medication calledsimvastatin (generic for Zocor) to lower your risk of heart attacks and strokes.  If you are willing to do so,  Let me know and I will send a prescription to your pharmacy.  This medication will require repeat blood work about 6 weeks after you start it to make sure it is not affecting your liver.  If you decide not do,  The natural remedies for cholesterol have not been proven to reduce your risk for a heart attack.  Red Yeast Rice particularly has not been proven to lower risk  but it does lower LDL cholesterol, so if you want to try it , the dose is 600 mg twice daily in capsule form, available OTC.    Regards,  Duncan Dull, MD

## 2019-09-22 NOTE — Assessment & Plan Note (Signed)
Based on current lipid profile, the risk of clinically significant Coronary artery disease is 15% over the next 10 years, using the Framingham risk calculator,  Use of statin advised.   Lab Results  Component Value Date   CHOL 255 (H) 09/21/2019   HDL 66.60 09/21/2019   LDLCALC 171 (H) 09/21/2019   TRIG 84.0 09/21/2019   CHOLHDL 4 09/21/2019

## 2019-09-22 NOTE — Assessment & Plan Note (Addendum)
Not at goal on telmisartan 40 mg daily dose.  Adding hctz 25 mg daily  Lab Results  Component Value Date   NA 136 09/21/2019   K 3.6 09/21/2019   CL 101 09/21/2019   CO2 27 09/21/2019   Lab Results  Component Value Date   CREATININE 0.91 09/21/2019

## 2019-09-22 NOTE — Assessment & Plan Note (Signed)
I have addressed  BMI and recommended a low glycemic index diet utilizing portion size reduction .  She is already exercising vigorously.  Request for phentermine deferred for one month in favor of trial of using Benefiber to suppress appetite.  Screening for lipid disorders, thyroid and diabetes to be done today.   

## 2019-09-28 ENCOUNTER — Other Ambulatory Visit: Payer: Self-pay | Admitting: Internal Medicine

## 2019-09-28 DIAGNOSIS — Z1231 Encounter for screening mammogram for malignant neoplasm of breast: Secondary | ICD-10-CM

## 2019-09-29 ENCOUNTER — Other Ambulatory Visit: Payer: Self-pay | Admitting: Internal Medicine

## 2019-09-29 DIAGNOSIS — E7800 Pure hypercholesterolemia, unspecified: Secondary | ICD-10-CM

## 2019-09-29 MED ORDER — SIMVASTATIN 20 MG PO TABS: 20.0000 mg | ORAL_TABLET | Freq: Every day | ORAL | 3 refills | Status: DC

## 2019-10-05 ENCOUNTER — Ambulatory Visit
Admission: RE | Admit: 2019-10-05 | Discharge: 2019-10-05 | Disposition: A | Discharge status: Home / Self Care | Source: Ambulatory Visit | Attending: Internal Medicine | Admitting: Internal Medicine

## 2019-10-05 DIAGNOSIS — Z1231 Encounter for screening mammogram for malignant neoplasm of breast: Secondary | ICD-10-CM | POA: Insufficient documentation

## 2019-10-08 ENCOUNTER — Telehealth: Payer: Self-pay | Admitting: Internal Medicine

## 2019-10-08 NOTE — Telephone Encounter (Signed)
Pt called about mammogram results. 

## 2019-10-08 NOTE — Telephone Encounter (Signed)
Pt requesting mammogram results

## 2019-10-08 NOTE — Telephone Encounter (Signed)
Normal.  [facility] will be sending her a letter.

## 2019-10-11 NOTE — Telephone Encounter (Signed)
Spoke with pt to let her know that her mammogram results was normal. Pt stated that she did receive her letter from [place] on Saturday.

## 2019-10-31 ENCOUNTER — Other Ambulatory Visit: Payer: Self-pay | Admitting: Internal Medicine

## 2019-11-10 ENCOUNTER — Other Ambulatory Visit: Payer: Self-pay

## 2019-11-10 ENCOUNTER — Other Ambulatory Visit (INDEPENDENT_AMBULATORY_CARE_PROVIDER_SITE_OTHER)

## 2019-11-10 DIAGNOSIS — E7800 Pure hypercholesterolemia, unspecified: Secondary | ICD-10-CM | POA: Diagnosis not present

## 2019-11-10 LAB — COMPREHENSIVE METABOLIC PANEL
ALT: 17 U/L (ref 0–35)
AST: 25 U/L (ref 0–37)
Albumin: 4.4 g/dL (ref 3.5–5.2)
Alkaline Phosphatase: 75 U/L (ref 39–117)
BUN: 20 mg/dL (ref 6–23)
CO2: 29 mEq/L (ref 19–32)
Calcium: 9.6 mg/dL (ref 8.4–10.5)
Chloride: 102 mEq/L (ref 96–112)
Creatinine, Ser: 0.79 mg/dL (ref 0.40–1.20)
GFR: 73.64 mL/min (ref >60.00)
Glucose, Bld: 121 mg/dL — ABNORMAL HIGH (ref 70–99)
Potassium: 3.9 mEq/L (ref 3.5–5.1)
Sodium: 138 mEq/L (ref 135–145)
Total Bilirubin: 0.5 mg/dL (ref 0.2–1.2)
Total Protein: 6.4 g/dL (ref 6.0–8.3)

## 2019-11-16 ENCOUNTER — Telehealth: Payer: Self-pay | Admitting: Internal Medicine

## 2019-11-16 ENCOUNTER — Other Ambulatory Visit: Payer: Self-pay | Admitting: Internal Medicine

## 2019-11-16 NOTE — Telephone Encounter (Signed)
Pt called wanted to know her results

## 2019-11-16 NOTE — Telephone Encounter (Signed)
Pt is requesting labs results from labs drawn on 11/10/2019.

## 2019-11-17 ENCOUNTER — Telehealth: Payer: Self-pay | Admitting: Internal Medicine

## 2019-11-17 NOTE — Telephone Encounter (Signed)
See result note message 

## 2019-11-17 NOTE — Telephone Encounter (Signed)
Patient called for her lab results, please call patient.

## 2019-12-25 ENCOUNTER — Other Ambulatory Visit: Payer: Self-pay | Admitting: Internal Medicine

## 2019-12-27 NOTE — Telephone Encounter (Signed)
Refill request for xanax, last seen 09-21-19, last filled 09-06-19.  Please advise.

## 2020-01-28 ENCOUNTER — Telehealth: Payer: Self-pay | Admitting: Internal Medicine

## 2020-01-28 ENCOUNTER — Encounter: Payer: Self-pay | Admitting: Internal Medicine

## 2020-01-28 ENCOUNTER — Telehealth (INDEPENDENT_AMBULATORY_CARE_PROVIDER_SITE_OTHER): Admitting: Internal Medicine

## 2020-01-28 DIAGNOSIS — Z9189 Other specified personal risk factors, not elsewhere classified: Secondary | ICD-10-CM | POA: Diagnosis not present

## 2020-01-28 DIAGNOSIS — I10 Essential (primary) hypertension: Secondary | ICD-10-CM

## 2020-01-28 DIAGNOSIS — G2581 Restless legs syndrome: Secondary | ICD-10-CM | POA: Diagnosis not present

## 2020-01-28 DIAGNOSIS — G609 Hereditary and idiopathic neuropathy, unspecified: Secondary | ICD-10-CM | POA: Diagnosis not present

## 2020-01-28 DIAGNOSIS — Z20822 Contact with and (suspected) exposure to covid-19: Secondary | ICD-10-CM

## 2020-01-28 DIAGNOSIS — S3992XA Unspecified injury of lower back, initial encounter: Secondary | ICD-10-CM

## 2020-01-28 MED ORDER — TIZANIDINE HCL 4 MG PO TABS
4.0000 mg | ORAL_TABLET | Freq: Four times a day (QID) | ORAL | 0 refills | Status: DC | PRN
Start: 2020-01-28 — End: 2020-10-05

## 2020-01-28 NOTE — Telephone Encounter (Signed)
Pt states that she is having restless legs at night and also pulled something in her back. She states that PCP knows about RLS. NO appts avail for today.  Please call back to advise

## 2020-01-28 NOTE — Assessment & Plan Note (Signed)
Exposure occurred on Monday.  Test of sick exposure is still pending.  Given her coworker's need to know if she has been exposed, I have advised her to get tested today at [facility] with a rapid test.

## 2020-01-28 NOTE — Patient Instructions (Signed)
Continue 2 Aleve every 12 hours (max dose)  Add 1000 mg tylenol every 12  hours  Adding tizanidine, a muscle relaxer as needed  If restless legs symptoms still present in 2 weeks  Will treat.  GO GET TESTED FOR COVID TODAY SO YOU CAN GIVE YOUR FRIEND AN ANSWER

## 2020-01-28 NOTE — Telephone Encounter (Signed)
Spoke with pt and she has been scheduled for an appt today at 11:30.

## 2020-01-28 NOTE — Progress Notes (Signed)
Virtual Visit via Caregility   This visit type was conducted due to national recommendations for restrictions regarding the COVID-19 pandemic (e.g. social distancing).  This format is felt to be most appropriate for this patient at this time.  All issues noted in this document were discussed and addressed.  No physical exam was performed (except for noted visual exam findings with Video Visits).   I connected with@ on 01/28/20 at 11:30 AM EDT by a video enabled telemedicine application  and verified that I am speaking with the correct person using two identifiers. Location patient: home Location provider: work or home office Persons participating in the virtual visit: patient, provider  I discussed the limitations, risks, security and privacy concerns of performing an evaluation and management service by telephone and the availability of in person appointments. I also discussed with the patient that there may be a patient responsible charge related to this service. The patient expressed understanding and agreed to proceed.   Reason for visit: leg pain   HPI:  64 yr old female with hyperlipidemia, on statin therapy since April,  Intermittent RLS symptoms (normal iron studies in April) presents with several complaints .  Had a back injury Tuesday while working out at the gym.  Pain localizes to lower back and does not radiate.   Improved with 2 adjustments.  But still sore in the morning.   Some upper back pain as well.  Skin feels sore to the touch   Legs feel jumpy at night since then,  Previously intermittent when mentioned in April.   Potential COVID exposure on Monday. Friend is sick and getting tested today. Patient worked with a Animator on  Wednesday who is planning to spend the weekend with a family member who has cancer, wants to know whether she should go.    ROS: See pertinent positives and negatives per HPI.  Past Medical History:  Diagnosis Date  . Allergy    pollen   .  Chicken pox   . Sinus bradycardia     Past Surgical History:  Procedure Laterality Date  . ABDOMINAL HYSTERECTOMY  2001  . LESION EXCISION  2008   anal area    Family History  Problem Relation Age of Onset  . Cancer Mother 40       melanoma  . Stroke Mother 31       secondary to septic emboli spine infection  . Heart disease Father 33       Heart failure  . Mental illness Father 30       dementia  . Breast cancer Neg Hx     SOCIAL HX:  reports that she has quit smoking. She has never used smokeless tobacco. She reports current alcohol use. She reports that she does not use drugs.   Current Outpatient Medications:  .  acyclovir (ZOVIRAX) 400 MG tablet, TAKE 1 TABLET BY MOUTH 5 TIMES DAILY FOR 5 DAYS, Disp: , Rfl:  .  ALPRAZolam (XANAX) 0.5 MG tablet, TAKE 1 TABLET (0.5 MG TOTAL) BY MOUTH DAILY AS NEEDED. FOR ANXIETY, Disp: 30 tablet, Rfl: 5 .  escitalopram (LEXAPRO) 10 MG tablet, TAKE 1 TABLET BY MOUTH EVERY DAY, Disp: 90 tablet, Rfl: 1 .  hydrochlorothiazide (HYDRODIURIL) 25 MG tablet, Take 1 tablet (25 mg total) by mouth daily., Disp: 90 tablet, Rfl: 3 .  Multiple Vitamins-Minerals (MULTIVITAMIN WITH MINERALS) tablet, Take 1 tablet by mouth daily., Disp: , Rfl:  .  simvastatin (ZOCOR) 20 MG tablet, Take 1 tablet (20  mg total) by mouth at bedtime., Disp: 90 tablet, Rfl: 3 .  telmisartan (MICARDIS) 40 MG tablet, TAKE 1 TABLET BY MOUTH EVERY DAY, Disp: 90 tablet, Rfl: 1 .  traZODone (DESYREL) 50 MG tablet, TAKE 1 TABLET BY MOUTH AT BEDTIME AS NEEDED FOR INSOMNIA, Disp: 90 tablet, Rfl: 0 .  tiZANidine (ZANAFLEX) 4 MG tablet, Take 1 tablet (4 mg total) by mouth every 6 (six) hours as needed for muscle spasms., Disp: 30 tablet, Rfl: 0  EXAM:  VITALS per patient if applicable:  GENERAL: alert, oriented, appears well and in no acute distress  HEENT: atraumatic, conjunttiva clear, no obvious abnormalities on inspection of external nose and ears  NECK: normal movements of the  head and neck  LUNGS: on inspection no signs of respiratory distress, breathing rate appears normal, no obvious gross SOB, gasping or wheezing  CV: no obvious cyanosis  MS: moves all visible extremities without noticeable abnormality  PSYCH/NEURO: pleasant and cooperative, no obvious depression or anxiety, speech and thought processing grossly intact  ASSESSMENT AND PLAN:  Discussed the following assessment and plan:  At increased risk of exposure to COVID-19 virus  Neuropathy, idiopathic  Restless legs syndrome  Exposure to COVID-19 virus  Essential hypertension  Injury of low back, initial encounter  At increased risk of exposure to COVID-19 virus Exposure occurred on Monday.  Test of sick exposure is still pending.  Given her coworker's need to know if she has been exposed, I have advised her to get tested today at Urgent Care with a rapid test.   Restless legs syndrome Screening labs for iron deficiency were normal.  Will treat for muscle spasm as a trial with tizanidine  ; if no improvement will start Mirapex or Requip  Exposure to COVID-19 virus Advised to get tested given her exposure 5 days ago. No symptoms   Essential hypertension Well controlled on current regimen by home readings . Renal function stable, no changes today  . Lab Results  Component Value Date   CREATININE 0.79 11/10/2019     Lower back injury Occurring during workout .  Continue nsaids, manipulation and adding tizanidine     I provided  30 minutes of  face-to-face time during this encounter reviewing patient's current problems and past surgeries, labs and imaging studies, providing counseling on the above mentioned problems , and coordination  of care . I discussed the assessment and treatment plan with the patient. The patient was provided an opportunity to ask questions and all were answered. The patient agreed with the plan and demonstrated an understanding of the instructions.   The  patient was advised to call back or seek an in-person evaluation if the symptoms worsen or if the condition fails to improve as anticipated.  I provided 30 minutes of non-face-to-face time during this encounter.   Sherlene Shams, MD

## 2020-01-30 DIAGNOSIS — S3992XA Unspecified injury of lower back, initial encounter: Secondary | ICD-10-CM | POA: Insufficient documentation

## 2020-01-30 DIAGNOSIS — Z8616 Personal history of COVID-19: Secondary | ICD-10-CM | POA: Insufficient documentation

## 2020-01-30 DIAGNOSIS — G2581 Restless legs syndrome: Secondary | ICD-10-CM | POA: Insufficient documentation

## 2020-01-30 NOTE — Assessment & Plan Note (Signed)
Well controlled on current regimen by home readings . Renal function stable, no changes today  . Lab Results  Component Value Date   CREATININE 0.79 11/10/2019

## 2020-01-30 NOTE — Assessment & Plan Note (Signed)
Advised to get tested given her exposure 5 days ago. No symptoms

## 2020-01-30 NOTE — Assessment & Plan Note (Signed)
Occurring during workout .  Continue nsaids, manipulation and adding tizanidine

## 2020-01-30 NOTE — Assessment & Plan Note (Addendum)
Screening labs for iron deficiency were normal.  Will treat for muscle spasm as a trial with tizanidine  ; if no improvement will start Mirapex or Requip

## 2020-01-31 ENCOUNTER — Telehealth: Payer: Self-pay | Admitting: Internal Medicine

## 2020-01-31 NOTE — Telephone Encounter (Signed)
Pt returned call

## 2020-01-31 NOTE — Telephone Encounter (Signed)
LMTCB

## 2020-01-31 NOTE — Telephone Encounter (Signed)
Pt tested positive for covid on 01/28/20. Pt wants to know when she can go back to work. Pt states that she started having symptoms on the 18th. Please call back to advise [phone]

## 2020-01-31 NOTE — Telephone Encounter (Signed)
Spoke with pt and informed her that she has to quarantine for at least 10 days and be symptom free for three days without taking any medications. Pt gave a verbal understanding.

## 2020-02-01 ENCOUNTER — Encounter: Payer: Self-pay | Admitting: Internal Medicine

## 2020-02-01 ENCOUNTER — Telehealth: Payer: Self-pay | Admitting: Nurse Practitioner

## 2020-02-01 NOTE — Telephone Encounter (Signed)
Called to discuss with Michelle Mahoney about Covid symptoms and the use of casirivimab/imdevimab, a combination monoclonal antibody infusion for those with mild to moderate Covid symptoms and at a high risk of hospitalization.     Pt is qualified for this infusion at the [facility] infusion center due to co-morbid conditions (as indicated below), however declines infusion at this time expressing mild symptoms of only fatigue which she contributes moreso to boredom and not being able to be active under quarantine.   Symptoms tier reviewed as well as criteria for ending isolation.  Symptoms reviewed that would warrant ED/Hospital evaluation. Preventative practices reviewed. Patient verbalized understanding. Patient advised to call back if he decides that he does want to get infusion. Callback number to the infusion center given. Patient advised to go to Urgent care or ED with severe symptoms. Last date she would be eligible for infusion is 02/03/20.    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

## 2020-02-01 NOTE — Telephone Encounter (Signed)
Chart updated to refledt positive covid test and infection.  .  Since she was not vaccinated,  She is at higher risk for decompensating .  Michelle Mahoney how do we refer for the infusion ?

## 2020-02-01 NOTE — Telephone Encounter (Signed)
I ahved called and made referral for the infusion center for this patient.

## 2020-02-03 ENCOUNTER — Other Ambulatory Visit: Payer: Self-pay | Admitting: Internal Medicine

## 2020-04-11 ENCOUNTER — Other Ambulatory Visit: Payer: Self-pay

## 2020-04-11 MED ORDER — TRAZODONE HCL 50 MG PO TABS: 50.0000 mg | ORAL_TABLET | Freq: Every evening | ORAL | 0 refills | Status: DC | PRN

## 2020-04-11 MED ORDER — TELMISARTAN 40 MG PO TABS: 40.0000 mg | ORAL_TABLET | Freq: Every day | ORAL | 1 refills | Status: DC

## 2020-04-11 MED ORDER — SIMVASTATIN 20 MG PO TABS: 20.0000 mg | ORAL_TABLET | Freq: Every day | ORAL | 3 refills | Status: DC

## 2020-06-12 ENCOUNTER — Other Ambulatory Visit: Payer: Self-pay | Admitting: Internal Medicine

## 2020-07-06 ENCOUNTER — Telehealth: Admitting: Internal Medicine

## 2020-07-25 ENCOUNTER — Other Ambulatory Visit: Payer: Self-pay | Admitting: Internal Medicine

## 2020-07-25 NOTE — Telephone Encounter (Signed)
RX Refill:xanax Last Seen:01-28-20 Last ordered:12-27-19

## 2020-08-07 ENCOUNTER — Other Ambulatory Visit: Payer: Self-pay | Admitting: Internal Medicine

## 2020-08-12 ENCOUNTER — Other Ambulatory Visit: Payer: Self-pay | Admitting: Internal Medicine

## 2020-10-05 ENCOUNTER — Encounter: Payer: Self-pay | Admitting: Internal Medicine

## 2020-10-05 ENCOUNTER — Other Ambulatory Visit: Payer: Self-pay

## 2020-10-05 ENCOUNTER — Ambulatory Visit (INDEPENDENT_AMBULATORY_CARE_PROVIDER_SITE_OTHER): Admitting: Internal Medicine

## 2020-10-05 VITALS — BP 116/64 | HR 66 | Temp 97.0°F | Resp 14 | Ht 62.0 in | Wt 138.2 lb

## 2020-10-05 DIAGNOSIS — B351 Tinea unguium: Secondary | ICD-10-CM | POA: Insufficient documentation

## 2020-10-05 DIAGNOSIS — F5105 Insomnia due to other mental disorder: Secondary | ICD-10-CM

## 2020-10-05 DIAGNOSIS — Z2821 Immunization not carried out because of patient refusal: Secondary | ICD-10-CM

## 2020-10-05 DIAGNOSIS — Z124 Encounter for screening for malignant neoplasm of cervix: Secondary | ICD-10-CM | POA: Insufficient documentation

## 2020-10-05 DIAGNOSIS — E7800 Pure hypercholesterolemia, unspecified: Secondary | ICD-10-CM

## 2020-10-05 DIAGNOSIS — Z8616 Personal history of COVID-19: Secondary | ICD-10-CM

## 2020-10-05 DIAGNOSIS — Z635 Disruption of family by separation and divorce: Secondary | ICD-10-CM

## 2020-10-05 DIAGNOSIS — I10 Essential (primary) hypertension: Secondary | ICD-10-CM

## 2020-10-05 DIAGNOSIS — F419 Anxiety disorder, unspecified: Secondary | ICD-10-CM

## 2020-10-05 DIAGNOSIS — Z0000 Encounter for general adult medical examination without abnormal findings: Secondary | ICD-10-CM | POA: Diagnosis not present

## 2020-10-05 DIAGNOSIS — Z90710 Acquired absence of both cervix and uterus: Secondary | ICD-10-CM

## 2020-10-05 DIAGNOSIS — E663 Overweight: Secondary | ICD-10-CM

## 2020-10-05 DIAGNOSIS — G2581 Restless legs syndrome: Secondary | ICD-10-CM

## 2020-10-05 LAB — COMPREHENSIVE METABOLIC PANEL
ALT: 20 U/L (ref 0–35)
AST: 31 U/L (ref 0–37)
Albumin: 4.6 g/dL (ref 3.5–5.2)
Alkaline Phosphatase: 70 U/L (ref 39–117)
BUN: 20 mg/dL (ref 6–23)
CO2: 29 mEq/L (ref 19–32)
Calcium: 9.9 mg/dL (ref 8.4–10.5)
Chloride: 102 mEq/L (ref 96–112)
Creatinine, Ser: 0.91 mg/dL (ref 0.40–1.20)
GFR: 67.22 mL/min (ref >60.00)
Glucose, Bld: 101 mg/dL — ABNORMAL HIGH (ref 70–99)
Potassium: 3.5 mEq/L (ref 3.5–5.1)
Sodium: 139 mEq/L (ref 135–145)
Total Bilirubin: 0.9 mg/dL (ref 0.2–1.2)
Total Protein: 6.9 g/dL (ref 6.0–8.3)

## 2020-10-05 LAB — LIPID PANEL
Cholesterol: 195 mg/dL (ref 0–200)
HDL: 68.8 mg/dL (ref >39.00)
LDL Cholesterol: 111 mg/dL — ABNORMAL HIGH (ref 0–99)
NonHDL: 125.76
Total CHOL/HDL Ratio: 3
Triglycerides: 72 mg/dL (ref 0.0–149.0)
VLDL: 14.4 mg/dL (ref 0.0–40.0)

## 2020-10-05 LAB — TSH: TSH: 2.78 u[IU]/mL (ref 0.35–4.50)

## 2020-10-05 MED ORDER — ALPRAZOLAM 0.5 MG PO TABS
ORAL_TABLET | ORAL | 5 refills | Status: DC
Start: 2020-10-05 — End: 2021-04-16

## 2020-10-05 NOTE — Assessment & Plan Note (Addendum)
Her last 2 PAP smears were HPV negative but no cervical cells were obtained .  She has no cervix on exam today.  No further testing is needed

## 2020-10-05 NOTE — Assessment & Plan Note (Signed)
Managed by Dr Theola Sequin   With weekly fluconazole for the past 3 months l.  LFTs needed

## 2020-10-05 NOTE — Progress Notes (Signed)
Patient ID: Michelle Mahoney, female    DOB: [birth year]  Age: 65 y.o. MRN: [id number]  The patient is here for annual preventive examination and management of other chronic and acute problems.   The risk factors are reflected in the social history.  The roster of all physicians providing medical care to patient - is listed in the Snapshot section of the chart.  Activities of daily living:  The patient is 100% independent in all ADLs: dressing, toileting, feeding as well as independent mobility  Home safety : The patient has smoke detectors in the home. They wear seatbelts.  There are no firearms at home. There is no violence in the home.   There is no risks for hepatitis, STDs or HIV. There is no   history of blood transfusion. They have no travel history to infectious disease endemic areas of the world.  The patient has seen their dentist in the last six month. They have seen their eye doctor in the last year. She denies any hearing difficulty with regard to whispered voices and some television programs.   She does not  have excessive sun exposure. Discussed the need for sun protection: hats, long sleeves and use of sunscreen if there is significant sun exposure.   Diet: the importance of a healthy diet is discussed. They do have a healthy diet.  The benefits of regular aerobic exercise were discussed. She  Works out 4 times per week ,  60 minutes.   Depression screen: there are no signs or vegative symptoms of depression- irritability, change in appetite, anhedonia, sadness/tearfullness.She has recently gone through an estrangement from her disabled husband, and although angered by his apparent infidelity with a lifelong friend ,  Is not depressed.   Cognitive assessment: the patient manages all their financial and personal affairs and is actively engaged. They could relate day,date,year and events; recalled 2/3 objects at 3 minutes; performed clock-face test normally.  The following portions  of the patient's history were reviewed and updated as appropriate: allergies, current medications, past family history, past medical history,  past surgical history, past social history  and problem list.  Visual acuity was not assessed per patient preference since she has regular follow up with her ophthalmologist. Hearing and body mass index were assessed and reviewed.   During the course of the visit the patient was educated and counseled about appropriate screening and preventive services including : fall prevention , diabetes screening, nutrition counseling, colorectal cancer screening, and recommended immunizations.    CC: The primary encounter diagnosis was COVID-19 vaccine dose declined. Diagnoses of Cervical cancer screening, History of 2019 novel coronavirus disease (COVID-19), Pure hypercholesterolemia, Essential hypertension, Onychomycosis, S/P total hysterectomy, Overweight (BMI 25.0-29.9), Encounter for preventive health examination, Marital estrangement, Insomnia secondary to anxiety, and Restless legs syndrome were also pertinent to this visit.  No new issues today , other than the news of her legal separation from husband  Hyperlipidemia:  Taking simvastatin .  No muscle aches.    Hypertension: patient checks blood pressure twice weekly at home.  Readings have been for the most part<130/80 at rest . Patient is following a reduce salt diet most days and is taking telmisartan and hctz as prescribed  History Michelle Mahoney has a past medical history of Allergy, Chicken pox, and Sinus bradycardia.   She has a past surgical history that includes Abdominal hysterectomy (2001) and Lesion excision (2008).   Her family history includes Cancer (age of onset: 107) in her mother; Heart disease (age  of onset: 30) in her father; Mental illness (age of onset: 69) in her father; Stroke (age of onset: 46) in her mother.She reports that she has quit smoking. She has never used smokeless tobacco. She reports  current alcohol use. She reports that she does not use drugs.  Outpatient Medications Prior to Visit  Medication Sig Dispense Refill  . acyclovir (ZOVIRAX) 400 MG tablet TAKE 1 TABLET BY MOUTH 5 TIMES DAILY FOR 5 DAYS    . escitalopram (LEXAPRO) 10 MG tablet TAKE 1 TABLET BY MOUTH EVERY DAY 90 tablet 1  . hydrochlorothiazide (HYDRODIURIL) 25 MG tablet Take 1 tablet (25 mg total) by mouth daily. 90 tablet 3  . Multiple Vitamins-Minerals (MULTIVITAMIN WITH MINERALS) tablet Take 1 tablet by mouth daily.    . simvastatin (ZOCOR) 20 MG tablet TAKE 1 TABLET BY MOUTH EVERYDAY AT BEDTIME 90 tablet 3  . telmisartan (MICARDIS) 40 MG tablet TAKE 1 TABLET BY MOUTH EVERY DAY 90 tablet 1  . traZODone (DESYREL) 50 MG tablet TAKE 1 TABLET BY MOUTH AT BEDTIME AS NEEDED FOR INSOMNIA 90 tablet 0  . ALPRAZolam (XANAX) 0.5 MG tablet TAKE ONE TABLET BY MOUTH EVERY DAY AS NEEDED FOR ANXIETY 30 tablet 0  . fluconazole (DIFLUCAN) 200 MG tablet Take 200 mg by mouth once a week.    Marland Kitchen tiZANidine (ZANAFLEX) 4 MG tablet Take 1 tablet (4 mg total) by mouth every 6 (six) hours as needed for muscle spasms. (Patient not taking: Reported on 10/05/2020) 30 tablet 0   No facility-administered medications prior to visit.    Review of Systems   Patient denies headache, fevers, malaise, unintentional weight loss, skin rash, eye pain, sinus congestion and sinus pain, sore throat, dysphagia,  hemoptysis , cough, dyspnea, wheezing, chest pain, palpitations, orthopnea, edema, abdominal pain, nausea, melena, diarrhea, constipation, flank pain, dysuria, hematuria, urinary  Frequency, nocturia, numbness, tingling, seizures,  Focal weakness, Loss of consciousness,  Tremor, insomnia, depression, anxiety, and suicidal ideation.     Objective:  BP 116/64 (BP Location: Left Arm, Patient Position: Sitting, Cuff Size: Normal)   Pulse 66   Temp (!) 97 F (36.1 C) (Oral)   Resp 14   Ht 5\' 2"  (1.575 m)   Wt 138 lb 3.2 oz (62.7 kg)   SpO2 97%    BMI 25.28 kg/m   Physical Exam  General Appearance:    Alert, cooperative, no distress, appears stated age  Head:    Normocephalic, without obvious abnormality, atraumatic  Eyes:    PERRL, conjunctiva/corneas clear, EOM's intact, fundi    benign, both eyes  Ears:    Normal TM's and external ear canals, both ears  Nose:   Nares normal, septum midline, mucosa normal, no drainage    or sinus tenderness  Throat:   Lips, mucosa, and tongue normal; teeth and gums normal  Neck:   Supple, symmetrical, trachea midline, no adenopathy;    thyroid:  no enlargement/tenderness/nodules; no carotid   bruit or JVD  Back:     Symmetric, no curvature, ROM normal, no CVA tenderness  Lungs:     Clear to auscultation bilaterally, respirations unlabored  Chest Wall:    No tenderness or deformity   Heart:    Regular rate and rhythm, S1 and S2 normal, no murmur, rub   or gallop  Breast Exam:    No tenderness, masses, or nipple abnormality  Abdomen:     Soft, non-tender, bowel sounds active all four quadrants,    no masses, no organomegaly  Genitalia:    Pelvic: cervix surgically absent, external genitalia normal, no adnexal masses or tenderness,  rectovaginal septum normal, uterus surgically  absent and vagina normal without discharge  Extremities:   Extremities normal, atraumatic, no cyanosis or edema  Pulses:   2+ and symmetric all extremities  Skin:   Skin color, texture, turgor normal, no rashes or lesions  Lymph nodes:   Cervical, supraclavicular, and axillary nodes normal  Neurologic:   CNII-XII intact, normal strength, sensation and reflexes    throughout    Assessment & Plan:   Problem List Items Addressed This Visit      Unprioritized   S/P total hysterectomy   Restless legs syndrome    Managed with tizanidine prn       RESOLVED: Overweight (BMI 25.0-29.9)    I have addressed  BMI and recommended a low glycemic index diet utilizing portion size reduction .  She is already exercising  vigorously.  Request for phentermine deferred for one month in favor of trial of using Benefiber to suppress appetite.  Screening for lipid disorders, thyroid and diabetes to be done today.        Onychomycosis    Managed by Dr Theola SequinIsenstein/Dasher   With weekly fluconazole for the past 3 months l.  LFTs needed       Relevant Medications   fluconazole (DIFLUCAN) 200 MG tablet   Marital estrangement    She continues to feel angry by her husband's decision to end a marriage  of 24 years ,  But she denies loneliness and depression       Insomnia secondary to anxiety    Continue citalopram daily .  Symptoms aggravated by husband's declining health , but since their estrangement,  She is no longer responsible for caring for him.  She is using alprazolam prn insomnia. The risks and benefits of benzodiazepine use were reviewed with patient today including excessive sedation leading to respiratory depression,  impaired thinking/driving, and addiction.  Patient was advised to avoid concurrent use with alcohol, to use medication only as needed and not to share with others  .       Relevant Medications   ALPRAZolam (XANAX) 0.5 MG tablet   Hyperlipidemia    Significant improvement in LDL with simvastatin 20 mg daily.  No changes today  Lab Results  Component Value Date   CHOL 195 10/05/2020   HDL 68.80 10/05/2020   LDLCALC 111 (H) 10/05/2020   TRIG 72.0 10/05/2020   CHOLHDL 3 10/05/2020   Lab Results  Component Value Date   ALT 20 10/05/2020   AST 31 10/05/2020   ALKPHOS 70 10/05/2020   BILITOT 0.9 10/05/2020         Relevant Orders   Lipid panel (Completed)   TSH (Completed)   History of 2019 novel coronavirus disease (COVID-19)    She recovered uneventfully after and exposure in August 2021.  She defers vaccination.  Spike antibody level pending       Relevant Orders   SARS-CoV-2 Semi-Quantitative Total Antibody, Spike   Essential hypertension    Well controlled on current  regimen of telmisartan 40 mg and hctz 25 mg daily  Renal function stable, no changes today  . Lab Results  Component Value Date   CREATININE 0.91 10/05/2020   Lab Results  Component Value Date   NA 139 10/05/2020   K 3.5 10/05/2020   CL 102 10/05/2020   CO2 29 10/05/2020  Relevant Orders   Comprehensive metabolic panel (Completed)   Encounter for preventive health examination    age appropriate education and counseling updated, referrals for preventative services and immunizations addressed, dietary and smoking counseling addressed, most recent labs reviewed.  I have personally reviewed and have noted:  1) the patient's medical and social history 2) The pt's use of alcohol, tobacco, and illicit drugs 3) The patient's current medications and supplements 4) Functional ability including ADL's, fall risk, home safety risk, hearing and visual impairment 5) Diet and physical activities 6) Evidence for depression or mood disorder 7) The patient's height, weight, and BMI have been recorded in the chart  I have made referrals, and provided counseling and education based on review of the above      Cervical cancer screening    Her last 2 PAP smears were HPV negative but no cervical cells were obtained .  She has no cervix on exam today.  No further testing is needed        Other Visit Diagnoses    COVID-19 vaccine dose declined    -  Primary   Relevant Orders   SARS-CoV-2 Semi-Quantitative Total Antibody, Spike      I have discontinued Annaliesa T. Latimore's tiZANidine. I am also having her maintain her multivitamin with minerals, acyclovir, hydrochlorothiazide, telmisartan, traZODone, simvastatin, escitalopram, fluconazole, and ALPRAZolam.  Meds ordered this encounter  Medications  . ALPRAZolam (XANAX) 0.5 MG tablet    Sig: TAKE ONE TABLET BY MOUTH EVERY DAY AS NEEDED FOR ANXIETY    Dispense:  30 tablet    Refill:  5    Refill for 30 days only.  OFFICE VISIT NEEDED  prior to any more refills    Medications Discontinued During This Encounter  Medication Reason  . tiZANidine (ZANAFLEX) 4 MG tablet   . ALPRAZolam (XANAX) 0.5 MG tablet Reorder    Follow-up: Return in about 6 months (around 04/06/2021).   Sherlene Shams, MD

## 2020-10-05 NOTE — Patient Instructions (Signed)
YOU HAVE NO CERVIX!  NO MORE PAP SMEARS ARE NEEDED  CONTINUE LEXAPRO DAILY.  USE ALPRAZOLAM "AS NEEDED "     Health Maintenance for Postmenopausal Women Menopause is a normal process in which your ability to get pregnant comes to an end. This process happens slowly over many months or years, usually between the ages of 65 and 61. Menopause is complete when you have missed your menstrual periods for 12 months. It is important to talk with your health care provider about some of the most common conditions that affect women after menopause (postmenopausal women). These include heart disease, cancer, and bone loss (osteoporosis). Adopting a healthy lifestyle and getting preventive care can help to promote your health and wellness. The actions you take can also lower your chances of developing some of these common conditions. What should I know about menopause? During menopause, you may get a number of symptoms, such as:  Hot flashes. These can be moderate or severe.  Night sweats.  Decrease in sex drive.  Mood swings.  Headaches.  Tiredness.  Irritability.  Memory problems.  Insomnia. Choosing to treat or not to treat these symptoms is a decision that you make with your health care provider. Do I need hormone replacement therapy?  Hormone replacement therapy is effective in treating symptoms that are caused by menopause, such as hot flashes and night sweats.  Hormone replacement carries certain risks, especially as you become older. If you are thinking about using estrogen or estrogen with progestin, discuss the benefits and risks with your health care provider. What is my risk for heart disease and stroke? The risk of heart disease, heart attack, and stroke increases as you age. One of the causes may be a change in the body's hormones during menopause. This can affect how your body uses dietary fats, triglycerides, and cholesterol. Heart attack and stroke are medical emergencies.  There are many things that you can do to help prevent heart disease and stroke. Watch your blood pressure  High blood pressure causes heart disease and increases the risk of stroke. This is more likely to develop in people who have high blood pressure readings, are of African descent, or are overweight.  Have your blood pressure checked: ? Every 3-5 years if you are 66-38 years of age. ? Every year if you are 17 years old or older. Eat a healthy diet  Eat a diet that includes plenty of vegetables, fruits, low-fat dairy products, and lean protein.  Do not eat a lot of foods that are high in solid fats, added sugars, or sodium.   Get regular exercise Get regular exercise. This is one of the most important things you can do for your health. Most adults should:  Try to exercise for at least 150 minutes each week. The exercise should increase your heart rate and make you sweat (moderate-intensity exercise).  Try to do strengthening exercises at least twice each week. Do these in addition to the moderate-intensity exercise.  Spend less time sitting. Even light physical activity can be beneficial. Other tips  Work with your health care provider to achieve or maintain a healthy weight.  Do not use any products that contain nicotine or tobacco, such as cigarettes, e-cigarettes, and chewing tobacco. If you need help quitting, ask your health care provider.  Know your numbers. Ask your health care provider to check your cholesterol and your blood sugar (glucose). Continue to have your blood tested as directed by your health care provider. Do I  need screening for cancer? Depending on your health history and family history, you may need to have cancer screening at different stages of your life. This may include screening for:  Breast cancer.  Cervical cancer.  Lung cancer.  Colorectal cancer. What is my risk for osteoporosis? After menopause, you may be at increased risk for osteoporosis.  Osteoporosis is a condition in which bone destruction happens more quickly than new bone creation. To help prevent osteoporosis or the bone fractures that can happen because of osteoporosis, you may take the following actions:  If you are 12-44 years old, get at least 1,000 mg of calcium and at least 600 mg of vitamin D per day.  If you are older than age 34 but younger than age 20, get at least 1,200 mg of calcium and at least 600 mg of vitamin D per day.  If you are older than age 41, get at least 1,200 mg of calcium and at least 800 mg of vitamin D per day. Smoking and drinking excessive alcohol increase the risk of osteoporosis. Eat foods that are rich in calcium and vitamin D, and do weight-bearing exercises several times each week as directed by your health care provider. How does menopause affect my mental health? Depression may occur at any age, but it is more common as you become older. Common symptoms of depression include:  Low or sad mood.  Changes in sleep patterns.  Changes in appetite or eating patterns.  Feeling an overall lack of motivation or enjoyment of activities that you previously enjoyed.  Frequent crying spells. Talk with your health care provider if you think that you are experiencing depression. General instructions See your health care provider for regular wellness exams and vaccines. This may include:  Scheduling regular health, dental, and eye exams.  Getting and maintaining your vaccines. These include: ? Influenza vaccine. Get this vaccine each year before the flu season begins. ? Pneumonia vaccine. ? Shingles vaccine. ? Tetanus, diphtheria, and pertussis (Tdap) booster vaccine. Your health care provider may also recommend other immunizations. Tell your health care provider if you have ever been abused or do not feel safe at home. Summary  Menopause is a normal process in which your ability to get pregnant comes to an end.  This condition causes  hot flashes, night sweats, decreased interest in sex, mood swings, headaches, or lack of sleep.  Treatment for this condition may include hormone replacement therapy.  Take actions to keep yourself healthy, including exercising regularly, eating a healthy diet, watching your weight, and checking your blood pressure and blood sugar levels.  Get screened for cancer and depression. Make sure that you are up to date with all your vaccines. This information is not intended to replace advice given to you by your health care provider. Make sure you discuss any questions you have with your health care provider. Document Revised: 05/20/2018 Document Reviewed: 05/20/2018 Elsevier Patient Education  2021 [employer].

## 2020-10-07 ENCOUNTER — Other Ambulatory Visit: Payer: Self-pay | Admitting: Internal Medicine

## 2020-10-07 DIAGNOSIS — Z635 Disruption of family by separation and divorce: Secondary | ICD-10-CM | POA: Insufficient documentation

## 2020-10-07 NOTE — Assessment & Plan Note (Signed)

## 2020-10-07 NOTE — Assessment & Plan Note (Signed)
I have addressed  BMI and recommended a low glycemic index diet utilizing portion size reduction .  She is already exercising vigorously.  Request for phentermine deferred for one month in favor of trial of using Benefiber to suppress appetite.  Screening for lipid disorders, thyroid and diabetes to be done today.

## 2020-10-07 NOTE — Assessment & Plan Note (Signed)
Managed with tizanidine prn

## 2020-10-07 NOTE — Assessment & Plan Note (Addendum)
Well controlled on current regimen of telmisartan 40 mg and hctz 25 mg daily  Renal function stable, no changes today  . Lab Results  Component Value Date   CREATININE 0.91 10/05/2020   Lab Results  Component Value Date   NA 139 10/05/2020   K 3.5 10/05/2020   CL 102 10/05/2020   CO2 29 10/05/2020

## 2020-10-07 NOTE — Assessment & Plan Note (Signed)
Continue citalopram daily .  Symptoms aggravated by husband's declining health , but since their estrangement,  She is no longer responsible for caring for him.  She is using alprazolam prn insomnia. The risks and benefits of benzodiazepine use were reviewed with patient today including excessive sedation leading to respiratory depression,  impaired thinking/driving, and addiction.  Patient was advised to avoid concurrent use with alcohol, to use medication only as needed and not to share with others  .

## 2020-10-07 NOTE — Assessment & Plan Note (Signed)
She continues to feel angry by her husband's decision to end a marriage  of 24 years ,  But she denies loneliness and depression

## 2020-10-07 NOTE — Assessment & Plan Note (Addendum)
She recovered uneventfully after and exposure in August 2021.  She defers vaccination.  Spike antibody level pending

## 2020-10-07 NOTE — Assessment & Plan Note (Addendum)
Significant improvement in LDL with simvastatin 20 mg daily.  No changes today  Lab Results  Component Value Date   CHOL 195 10/05/2020   HDL 68.80 10/05/2020   LDLCALC 111 (H) 10/05/2020   TRIG 72.0 10/05/2020   CHOLHDL 3 10/05/2020   Lab Results  Component Value Date   ALT 20 10/05/2020   AST 31 10/05/2020   ALKPHOS 70 10/05/2020   BILITOT 0.9 10/05/2020

## 2020-10-09 LAB — SARS-COV-2 SEMI-QUANTITATIVE TOTAL ANTIBODY, SPIKE: SARS COV2 AB, Total Spike Semi QN: 410 U/mL — ABNORMAL HIGH (ref <0.8)

## 2020-10-10 ENCOUNTER — Other Ambulatory Visit: Payer: Self-pay | Admitting: Internal Medicine

## 2020-10-10 DIAGNOSIS — Z1231 Encounter for screening mammogram for malignant neoplasm of breast: Secondary | ICD-10-CM

## 2020-10-13 ENCOUNTER — Other Ambulatory Visit: Payer: Self-pay

## 2020-10-13 ENCOUNTER — Ambulatory Visit
Admission: RE | Admit: 2020-10-13 | Discharge: 2020-10-13 | Disposition: A | Discharge status: Home / Self Care | Source: Ambulatory Visit | Attending: Internal Medicine | Admitting: Internal Medicine

## 2020-10-13 DIAGNOSIS — Z1231 Encounter for screening mammogram for malignant neoplasm of breast: Secondary | ICD-10-CM | POA: Insufficient documentation

## 2020-11-01 ENCOUNTER — Other Ambulatory Visit: Payer: Self-pay | Admitting: Internal Medicine

## 2021-01-15 ENCOUNTER — Other Ambulatory Visit: Payer: Self-pay | Admitting: Internal Medicine

## 2021-01-25 ENCOUNTER — Telehealth: Payer: Self-pay | Admitting: Internal Medicine

## 2021-01-25 NOTE — Telephone Encounter (Signed)
Patient informed, Due to the high volume of calls and your symptoms we have to forward your call to our Triage Nurse to expedient your call. Please hold for the transfer.  Patient transferred to Access Nurse. Due to thinking she may have a UTI and wants to come have a urine lab done.No openings in office or virtual.

## 2021-01-25 NOTE — Telephone Encounter (Signed)
Patient stated she would call office again to see if she can just drop urine off to be tested per access nurse. No available appointment. Access nurse stated she needs to be seen within two weeks.

## 2021-01-25 NOTE — Telephone Encounter (Signed)
Patient needs to be made aware of the message from Dr Darrick Huntsman.  My protocol for treating UTIs:  patient makes lab appt for collection of specimen and has a 10 minute phone/video with me 48 hours later. Since I am on vacation until Tuesday I cannot do this so you wil need to sen her to urgent cae

## 2021-01-26 NOTE — Telephone Encounter (Signed)
Patient was evaluated at [facility] yesterday for UTI and treated.

## 2021-03-02 ENCOUNTER — Other Ambulatory Visit: Payer: Self-pay | Admitting: Internal Medicine

## 2021-04-16 ENCOUNTER — Other Ambulatory Visit: Payer: Self-pay | Admitting: Internal Medicine

## 2021-04-16 NOTE — Telephone Encounter (Signed)
LAST REFILL WITHOUT IN OFFICE VISIT , PLEASE SCHEDULE  

## 2021-04-16 NOTE — Telephone Encounter (Signed)
RX Refill: xanax Last Seen: 10-05-20 Last Ordered: 10-05-20 Next Appt: NA

## 2021-05-22 ENCOUNTER — Other Ambulatory Visit: Payer: Self-pay | Admitting: Internal Medicine

## 2021-05-22 NOTE — Telephone Encounter (Signed)
I asked you to schedule her an OV last month with the refill request,  You did not respond so please do so now,  LAST REFILL WITHOUT IN OFFICE VISIT , PLEASE SCHEDULE

## 2021-05-22 NOTE — Telephone Encounter (Signed)
RX Refill: xanax Last Seen: 10-05-20 Last Ordered: 04-16-21 Next Appt: NA

## 2021-07-01 ENCOUNTER — Other Ambulatory Visit: Payer: Self-pay | Admitting: Internal Medicine

## 2021-07-03 ENCOUNTER — Other Ambulatory Visit: Payer: Self-pay | Admitting: Internal Medicine

## 2021-07-03 NOTE — Telephone Encounter (Signed)
Patient has not been seen since April .  I cannot refill a controlled substance without an appt . She was supposed to be advised of this last month whe I refilled as a courtesy.

## 2021-07-05 NOTE — Telephone Encounter (Signed)
I called and spoke with the patient and informed her that she needed a visit with the provider and she stated she would call back to schedule. Syann Cupples,cma

## 2021-07-12 ENCOUNTER — Other Ambulatory Visit: Payer: Self-pay

## 2021-07-12 NOTE — Telephone Encounter (Signed)
Refilled: 05/22/2021 Last OV: 10/05/2020 Next OV: 07/25/2021

## 2021-07-13 ENCOUNTER — Other Ambulatory Visit: Payer: Self-pay | Admitting: Internal Medicine

## 2021-07-13 MED ORDER — ALPRAZOLAM 0.5 MG PO TABS: ORAL_TABLET | ORAL | 0 refills | Status: DC

## 2021-07-16 ENCOUNTER — Ambulatory Visit: Admitting: Internal Medicine

## 2021-07-25 ENCOUNTER — Other Ambulatory Visit: Payer: Self-pay

## 2021-07-25 ENCOUNTER — Encounter: Payer: Self-pay | Admitting: Internal Medicine

## 2021-07-25 ENCOUNTER — Ambulatory Visit (INDEPENDENT_AMBULATORY_CARE_PROVIDER_SITE_OTHER): Admitting: Internal Medicine

## 2021-07-25 VITALS — BP 132/70 | HR 63 | Temp 98.1°F | Ht 62.0 in | Wt 139.6 lb

## 2021-07-25 DIAGNOSIS — E7800 Pure hypercholesterolemia, unspecified: Secondary | ICD-10-CM | POA: Diagnosis not present

## 2021-07-25 DIAGNOSIS — I10 Essential (primary) hypertension: Secondary | ICD-10-CM | POA: Diagnosis not present

## 2021-07-25 DIAGNOSIS — R748 Abnormal levels of other serum enzymes: Secondary | ICD-10-CM | POA: Diagnosis not present

## 2021-07-25 DIAGNOSIS — F5105 Insomnia due to other mental disorder: Secondary | ICD-10-CM

## 2021-07-25 DIAGNOSIS — F419 Anxiety disorder, unspecified: Secondary | ICD-10-CM

## 2021-07-25 DIAGNOSIS — F411 Generalized anxiety disorder: Secondary | ICD-10-CM

## 2021-07-25 MED ORDER — ZOSTER VAC RECOMB ADJUVANTED 50 MCG/0.5ML IM SUSR: 0.5000 mL | Freq: Once | INTRAMUSCULAR | 1 refills | Status: AC

## 2021-07-25 NOTE — Progress Notes (Signed)
Subjective:  Patient ID: Michelle Mahoney, female    DOB: 1955/06/27  Age: 66 y.o. MRN: [id number]  CC: The primary encounter diagnosis was Essential hypertension. Diagnoses of Pure hypercholesterolemia, Elevated liver enzymes, Insomnia secondary to anxiety, and Anxiety state were also pertinent to this visit.   This visit occurred during the SARS-CoV-2 public health emergency.  Safety protocols were in place, including screening questions prior to the visit, additional usage of staff PPE, and extensive cleaning of exam room while observing appropriate contact time as indicated for disinfecting solutions.    HPI Michelle Mahoney presents for  Chief Complaint  Patient presents with   Follow-up    6 month follow up on hypertension, hyperlipidemia and anxiety    1) Hypertension: patient checks blood pressure twice weekly at home.  Readings have been for the most part  < 140/80 at rest . Patient is following a reduce salt diet most days and is taking medications as prescribed (telmisartan and hctz)  2) GAD:  has been taking lexapro  for management of anxiety related to recent divorce.  Not sleeping well.  Using alprazolam prn . Discussed alternatives and need to limit benzo's.  No prior trial of trazodone     Outpatient Medications Prior to Visit  Medication Sig Dispense Refill   acyclovir (ZOVIRAX) 400 MG tablet TAKE 1 TABLET BY MOUTH 5 TIMES DAILY FOR 5 DAYS     ALPRAZolam (XANAX) 0.5 MG tablet TAKE ONE TABLET BY MOUTH EVERY DAY AS NEEDED FOR ANXIETY 30 tablet 0   escitalopram (LEXAPRO) 10 MG tablet TAKE 1 TABLET BY MOUTH EVERY DAY 90 tablet 1   hydrochlorothiazide (HYDRODIURIL) 25 MG tablet TAKE 1 TABLET BY MOUTH EVERY DAY 90 tablet 3   Multiple Vitamins-Minerals (MULTIVITAMIN WITH MINERALS) tablet Take 1 tablet by mouth daily.     simvastatin (ZOCOR) 20 MG tablet TAKE 1 TABLET BY MOUTH EVERYDAY AT BEDTIME 90 tablet 3   telmisartan (MICARDIS) 40 MG tablet TAKE 1 TABLET BY MOUTH EVERY DAY 90  tablet 1   traZODone (DESYREL) 50 MG tablet TAKE 1 TABLET BY MOUTH AT BEDTIME AS NEEDED FOR INSOMNIA 30 tablet 2   fluconazole (DIFLUCAN) 200 MG tablet Take 200 mg by mouth once a week. (Patient not taking: Reported on 07/25/2021)     No facility-administered medications prior to visit.    Review of Systems;  Patient denies headache, fevers, malaise, unintentional weight loss, skin rash, eye pain, sinus congestion and sinus pain, sore throat, dysphagia,  hemoptysis , cough, dyspnea, wheezing, chest pain, palpitations, orthopnea, edema, abdominal pain, nausea, melena, diarrhea, constipation, flank pain, dysuria, hematuria, urinary  Frequency, nocturia, numbness, tingling, seizures,  Focal weakness, Loss of consciousness,  Tremor, insomnia, depression, anxiety, and suicidal ideation.      Objective:  BP 132/70 (BP Location: Left Arm, Patient Position: Sitting, Cuff Size: Normal)    Pulse 63    Temp 98.1 F (36.7 C) (Oral)    Ht 5' 2"  (1.575 m)    Wt 139 lb 9.6 oz (63.3 kg)    SpO2 97%    BMI 25.53 kg/m   BP Readings from Last 3 Encounters:  07/25/21 132/70  10/05/20 116/64  09/21/19 (!) 142/76    Wt Readings from Last 3 Encounters:  07/25/21 139 lb 9.6 oz (63.3 kg)  10/05/20 138 lb 3.2 oz (62.7 kg)  01/28/20 130 lb (59 kg)    General appearance: alert, cooperative and appears stated age Ears: normal TM's and external ear canals  both ears Throat: lips, mucosa, and tongue normal; teeth and gums normal Neck: no adenopathy, no carotid bruit, supple, symmetrical, trachea midline and thyroid not enlarged, symmetric, no tenderness/mass/nodules Back: symmetric, no curvature. ROM normal. No CVA tenderness. Lungs: clear to auscultation bilaterally Heart: regular rate and rhythm, S1, S2 normal, no murmur, click, rub or gallop Abdomen: soft, non-tender; bowel sounds normal; no masses,  no organomegaly Pulses: 2+ and symmetric Skin: Skin color, texture, turgor normal. No rashes or  lesions Lymph nodes: Cervical, supraclavicular, and axillary nodes normal.  Lab Results  Component Value Date   HGBA1C 5.4 12/22/2014    Lab Results  Component Value Date   CREATININE 0.87 07/25/2021   CREATININE 0.91 10/05/2020   CREATININE 0.79 11/10/2019    Lab Results  Component Value Date   WBC 5.5 12/18/2017   HGB 14.5 12/18/2017   HCT 41.5 12/18/2017   PLT 213 12/18/2017   GLUCOSE 91 07/25/2021   CHOL 197 07/25/2021   TRIG 147.0 07/25/2021   HDL 65.70 07/25/2021   LDLCALC 102 (H) 07/25/2021   ALT 43 (H) 07/25/2021   AST 43 (H) 07/25/2021   NA 140 07/25/2021   K 3.5 07/25/2021   CL 101 07/25/2021   CREATININE 0.87 07/25/2021   BUN 25 (H) 07/25/2021   CO2 35 (H) 07/25/2021   TSH 2.78 10/05/2020   HGBA1C 5.4 12/22/2014   MICROALBUR <0.7 07/25/2021    MM 3D SCREEN BREAST BILATERAL  Result Date: 10/18/2020 CLINICAL DATA:  Screening. EXAM: DIGITAL SCREENING BILATERAL MAMMOGRAM WITH TOMOSYNTHESIS AND CAD TECHNIQUE: Bilateral screening digital craniocaudal and mediolateral oblique mammograms were obtained. Bilateral screening digital breast tomosynthesis was performed. The images were evaluated with computer-aided detection. COMPARISON:  Previous exam(s). ACR Breast Density Category b: There are scattered areas of fibroglandular density. FINDINGS: There are no findings suspicious for malignancy. The images were evaluated with computer-aided detection. IMPRESSION: No mammographic evidence of malignancy. A result letter of this screening mammogram will be mailed directly to the patient. RECOMMENDATION: Screening mammogram in one year. (Code:SM-B-01Y) BI-RADS CATEGORY  1: Negative. Electronically Signed   By: Margarette Canada M.D.   On: 10/18/2020 08:14    Assessment & Plan:   Problem List Items Addressed This Visit     Anxiety state    Managed with lexapro.  Recommend continued longterm  Use       Elevated liver enzymes    Etiology unclear; enzyme elevation is mild  Will  Return for repeat testing       Relevant Orders   Comprehensive metabolic panel   Essential hypertension - Primary    Well controlled on current regimen. Renal function stable, no changes today.  Lab Results  Component Value Date   CREATININE 0.87 07/25/2021   Lab Results  Component Value Date   NA 140 07/25/2021   K 3.5 07/25/2021   CL 101 07/25/2021   CO2 35 (H) 07/25/2021         Relevant Orders   Comp Met (CMET) (Completed)   Urine Microalbumin w/creat. ratio (Completed)   Hyperlipidemia    Significant improvement in LDL with simvastatin 20 mg daily.  No changes today  Lab Results  Component Value Date   CHOL 197 07/25/2021   HDL 65.70 07/25/2021   LDLCALC 102 (H) 07/25/2021   TRIG 147.0 07/25/2021   CHOLHDL 3 07/25/2021         Relevant Orders   Lipid Profile (Completed)   Insomnia secondary to anxiety    Continue citalopram  daily .  She is using alprazolam for insomnia. The risks and benefits of benzodiazepine use were reviewed with patient today including excessive sedation leading to respiratory depression,  impaired thinking/driving, and addiction.  Recommend use of trazodone instead for safer management        I spent 30 minutes dedicated to the care of this patient on the date of this encounter to include pre-visit review of patient's medical history,  most recent imaging studies, Face-to-face time with the patient , and post visit ordering of testing and therapeutics.    Follow-up: Return in about 6 months (around 01/22/2022).   Crecencio Mc, MD

## 2021-07-25 NOTE — Patient Instructions (Addendum)
Take the lexapro daily in the morning.  This is your anti depressant.   Take the trazodone at night to help you rest.  It is for insomnia .   Save the alprazolam for panic attacks ,  insomnia lasting over an hour,  or RAGE  ( it is addictive ,  and goes to work in 15 minutes )   The simvastatin is for cholesterol and should be taken at night  ok to take with telmisartan and trazodone   Telmisartan  should be taken daily in the evening for blood pressure control.    Hctz is taken in the morning for blood pressure control   Strokes are prevented by controlling blood  pressure and cholesterol    The ShingRx vaccine is now available in local pharmacies and is much more protective than Zostavaxs,  It is therefore ADVISED for all interested adults over 50 to prevent shingles.    Take the hctz in the morning for BP Take the telmisartan at night for BP  Take the trazodone as needed for insomnia at night  Take the lexapro after breakfast.  This is your antidepressant  Take the simvastatin at night to lower your cholesterol   Use the alprazolam SPARINGLY for severe anxiety because it is addicting

## 2021-07-26 LAB — COMPREHENSIVE METABOLIC PANEL
ALT: 43 U/L — ABNORMAL HIGH (ref 0–35)
AST: 43 U/L — ABNORMAL HIGH (ref 0–37)
Albumin: 4.3 g/dL (ref 3.5–5.2)
Alkaline Phosphatase: 75 U/L (ref 39–117)
BUN: 25 mg/dL — ABNORMAL HIGH (ref 6–23)
CO2: 35 mEq/L — ABNORMAL HIGH (ref 19–32)
Calcium: 9.8 mg/dL (ref 8.4–10.5)
Chloride: 101 mEq/L (ref 96–112)
Creatinine, Ser: 0.87 mg/dL (ref 0.40–1.20)
GFR: 70.54 mL/min (ref >60.00)
Glucose, Bld: 91 mg/dL (ref 70–99)
Potassium: 3.5 mEq/L (ref 3.5–5.1)
Sodium: 140 mEq/L (ref 135–145)
Total Bilirubin: 0.6 mg/dL (ref 0.2–1.2)
Total Protein: 6.4 g/dL (ref 6.0–8.3)

## 2021-07-26 LAB — LIPID PANEL
Cholesterol: 197 mg/dL (ref 0–200)
HDL: 65.7 mg/dL (ref >39.00)
LDL Cholesterol: 102 mg/dL — ABNORMAL HIGH (ref 0–99)
NonHDL: 131.21
Total CHOL/HDL Ratio: 3
Triglycerides: 147 mg/dL (ref 0.0–149.0)
VLDL: 29.4 mg/dL (ref 0.0–40.0)

## 2021-07-26 LAB — MICROALBUMIN / CREATININE URINE RATIO
Creatinine,U: 41 mg/dL
Microalb Creat Ratio: 1.7 mg/g (ref 0.0–30.0)
Microalb, Ur: <0.7 mg/dL (ref 0.0–1.9)

## 2021-07-28 DIAGNOSIS — R748 Abnormal levels of other serum enzymes: Secondary | ICD-10-CM | POA: Insufficient documentation

## 2021-07-28 NOTE — Assessment & Plan Note (Signed)
Well controlled on current regimen. Renal function stable, no changes today.  Lab Results  Component Value Date   CREATININE 0.87 07/25/2021   Lab Results  Component Value Date   NA 140 07/25/2021   K 3.5 07/25/2021   CL 101 07/25/2021   CO2 35 (H) 07/25/2021

## 2021-07-28 NOTE — Assessment & Plan Note (Signed)
Etiology unclear; enzyme elevation is mild  Will Return for repeat testing

## 2021-07-28 NOTE — Assessment & Plan Note (Signed)
Continue citalopram daily .  She is using alprazolam for insomnia. The risks and benefits of benzodiazepine use were reviewed with patient today including excessive sedation leading to respiratory depression,  impaired thinking/driving, and addiction.  Recommend use of trazodone instead for safer management

## 2021-07-28 NOTE — Assessment & Plan Note (Signed)
Significant improvement in LDL with simvastatin 20 mg daily.  No changes today  Lab Results  Component Value Date   CHOL 197 07/25/2021   HDL 65.70 07/25/2021   LDLCALC 102 (H) 07/25/2021   TRIG 147.0 07/25/2021   CHOLHDL 3 07/25/2021

## 2021-07-28 NOTE — Assessment & Plan Note (Signed)
Managed with lexapro.  Recommend continued longterm  Use

## 2021-08-05 ENCOUNTER — Other Ambulatory Visit: Payer: Self-pay | Admitting: Internal Medicine

## 2021-08-08 ENCOUNTER — Telehealth: Payer: Self-pay | Admitting: Internal Medicine

## 2021-08-08 NOTE — Telephone Encounter (Signed)
Patient would like a call back from office. She has some questions about her 07/25/2020 lab results. ?

## 2021-08-08 NOTE — Telephone Encounter (Signed)
Called and spoke with pt in regards to her lab results. Pt gave a verbal understanding and schedule a follow up lab appt.  ?

## 2021-08-22 ENCOUNTER — Other Ambulatory Visit (INDEPENDENT_AMBULATORY_CARE_PROVIDER_SITE_OTHER)

## 2021-08-22 ENCOUNTER — Other Ambulatory Visit: Payer: Self-pay

## 2021-08-22 DIAGNOSIS — R748 Abnormal levels of other serum enzymes: Secondary | ICD-10-CM

## 2021-08-23 LAB — COMPREHENSIVE METABOLIC PANEL
ALT: 47 U/L — ABNORMAL HIGH (ref 0–35)
AST: 39 U/L — ABNORMAL HIGH (ref 0–37)
Albumin: 4.2 g/dL (ref 3.5–5.2)
Alkaline Phosphatase: 76 U/L (ref 39–117)
BUN: 22 mg/dL (ref 6–23)
CO2: 29 mEq/L (ref 19–32)
Calcium: 9.3 mg/dL (ref 8.4–10.5)
Chloride: 101 mEq/L (ref 96–112)
Creatinine, Ser: 0.87 mg/dL (ref 0.40–1.20)
GFR: 70.5 mL/min (ref >60.00)
Glucose, Bld: 88 mg/dL (ref 70–99)
Potassium: 3.4 mEq/L — ABNORMAL LOW (ref 3.5–5.1)
Sodium: 138 mEq/L (ref 135–145)
Total Bilirubin: 0.5 mg/dL (ref 0.2–1.2)
Total Protein: 6.3 g/dL (ref 6.0–8.3)

## 2021-08-23 LAB — HEPATITIS PANEL, ACUTE
Hep A IgM: NONREACTIVE
Hep B C IgM: NONREACTIVE
Hepatitis B Surface Ag: NONREACTIVE
Hepatitis C Ab: NONREACTIVE
SIGNAL TO CUT-OFF: <0.02 (ref <1.00)

## 2021-08-23 NOTE — Addendum Note (Signed)
Addended by: Sherlene Shams on: 08/23/2021 05:10 PM ? ? Modules accepted: Orders ? ?

## 2021-08-24 ENCOUNTER — Other Ambulatory Visit: Payer: Self-pay | Admitting: Internal Medicine

## 2021-08-30 ENCOUNTER — Other Ambulatory Visit: Payer: Self-pay | Admitting: Internal Medicine

## 2021-09-05 ENCOUNTER — Other Ambulatory Visit (INDEPENDENT_AMBULATORY_CARE_PROVIDER_SITE_OTHER)

## 2021-09-05 ENCOUNTER — Encounter: Payer: Self-pay | Admitting: Internal Medicine

## 2021-09-05 DIAGNOSIS — R768 Other specified abnormal immunological findings in serum: Secondary | ICD-10-CM

## 2021-09-05 DIAGNOSIS — R7401 Elevation of levels of liver transaminase levels: Secondary | ICD-10-CM

## 2021-09-05 LAB — IBC + FERRITIN
Ferritin: 24.2 ng/mL (ref 10.0–291.0)
Iron: 78 ug/dL (ref 42–145)
Saturation Ratios: 21.8 % (ref 20.0–50.0)
TIBC: 358.4 ug/dL (ref 250.0–450.0)
Transferrin: 256 mg/dL (ref 212.0–360.0)

## 2021-09-07 LAB — ANTI-SMITH ANTIBODY: ENA SM Ab Ser-aCnc: <1 AI

## 2021-09-10 LAB — ANTI-MICROSOMAL ANTIBODY LIVER / KIDNEY: LKM1 Ab: <=20 U (ref <=20.0)

## 2021-09-10 LAB — ANTI-NUCLEAR AB-TITER (ANA TITER)
ANA TITER: 1:80 {titer} — ABNORMAL HIGH
ANA Titer 1: 1:320 {titer} — ABNORMAL HIGH

## 2021-09-10 LAB — ANA: Anti Nuclear Antibody (ANA): POSITIVE — AB

## 2021-09-10 LAB — ANTI-SMOOTH MUSCLE ANTIBODY, IGG: Actin (Smooth Muscle) Antibody (IGG): 36 U — ABNORMAL HIGH (ref <20)

## 2021-09-10 LAB — MITOCHONDRIAL ANTIBODIES: Mitochondrial M2 Ab, IgG: <=20 U (ref <=20.0)

## 2021-09-10 LAB — ALPHA-1-ANTITRYPSIN: A-1 Antitrypsin, Ser: 137 mg/dL (ref 83–199)

## 2021-09-10 LAB — CERULOPLASMIN: Ceruloplasmin: 26 mg/dL (ref 18–53)

## 2021-09-11 NOTE — Addendum Note (Signed)
Addended by: Sherlene Shams on: 09/11/2021 12:37 PM ? ? Modules accepted: Orders ? ?

## 2021-09-12 ENCOUNTER — Encounter: Payer: Self-pay | Admitting: Internal Medicine

## 2021-09-12 ENCOUNTER — Telehealth (INDEPENDENT_AMBULATORY_CARE_PROVIDER_SITE_OTHER): Admitting: Internal Medicine

## 2021-09-12 DIAGNOSIS — R748 Abnormal levels of other serum enzymes: Secondary | ICD-10-CM | POA: Diagnosis not present

## 2021-09-12 NOTE — Progress Notes (Signed)
Virtual Visit via Caregility Note ? ?This visit type was conducted due to national recommendations for restrictions regarding the COVID-19 pandemic (e.g. social distancing).  This format is felt to be most appropriate for this patient at this time.  All issues noted in this document were discussed and addressed.  No physical exam was performed (except for noted visual exam findings with Video Visits).  ? ?I connected withNAME@ on 09/12/21 at  4:30 PM EDT by a video enabled telemedicine application  and verified that I am speaking with the correct person using two identifiers. ?Location patient: home ?Location provider: work or home office ?Persons participating in the virtual visit: patient, provider ? ?I discussed the limitations, risks, security and privacy concerns of performing an evaluation and management service by telephone and the availability of in person appointments. I also discussed with the patient that there may be a patient responsible charge related to this service. The patient expressed understanding and agreed to proceed. ? ? ?Reason for visit: abnormal liver enzymes  positive ANA ? ?HPI: ? ?66 yr old female with hypertension, hyperlipidemia managed with simvastatin since April 2021 presents for discussion of recent serologies .  Noted to have mildly elevated AST/ALT in February during standard surveillance.  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 ?She exercises regularly with a trainer and denies any recent use of supplements . Alcohol consumption is one beer per month.  No excessive use of tylenol, (2 daily max).  Denies nausea, dark colored urine, unintentional weight loss.  Remote history of tattoo,  acute hepatitis panel is negative  ? ?No FH of hepatitis  ? ? ?ROS: See pertinent positives and negatives per HPI. ? ?Past Medical History:  ?Diagnosis Date   ? Allergy   ? pollen   ? Chicken pox   ? Sinus bradycardia   ? ? ?Past Surgical History:  ?Procedure Laterality Date  ? ABDOMINAL HYSTERECTOMY  2001  ? LESION EXCISION  2008  ? anal area  ? ? ?Family History  ?Problem Relation Age of Onset  ? Cancer Mother 38  ?     melanoma  ? Stroke Mother 31  ?     secondary to septic emboli spine infection  ? Heart disease Father 5  ?     Heart failure  ? Mental illness Father 76  ?     dementia  ? Breast cancer Neg Hx   ? ? ?SOCIAL HX:  reports that she has quit smoking. She has never used smokeless tobacco. She reports that she does not currently use alcohol. She reports that she does not use drugs.  ? ? ?Current Outpatient Medications:  ?  acyclovir (ZOVIRAX) 400 MG tablet, TAKE 1 TABLET BY MOUTH 5 TIMES DAILY FOR 5 DAYS, Disp: , Rfl:  ?  ALPRAZolam (XANAX) 0.5 MG tablet, TAKE ONE TABLET BY MOUTH EVERY DAY AS NEEDED FOR ANXIETY, Disp: 30 tablet, Rfl: 5 ?  escitalopram (LEXAPRO) 10 MG tablet, TAKE 1 TABLET BY MOUTH EVERY DAY, Disp: 90 tablet, Rfl: 1 ?  hydrochlorothiazide (HYDRODIURIL) 25 MG tablet, TAKE 1 TABLET BY MOUTH EVERY DAY, Disp: 90 tablet, Rfl: 3 ?  Multiple Vitamins-Minerals (MULTIVITAMIN WITH MINERALS) tablet, Take 1 tablet by mouth daily., Disp: , Rfl:  ?  simvastatin (ZOCOR) 20 MG tablet, TAKE 1 TABLET BY MOUTH EVERYDAY AT BEDTIME, Disp: 90 tablet, Rfl:  3 ?  telmisartan (MICARDIS) 40 MG tablet, TAKE 1 TABLET BY MOUTH EVERY DAY, Disp: 90 tablet, Rfl: 1 ?  traZODone (DESYREL) 50 MG tablet, TAKE 1 TABLET BY MOUTH EVERY DAY AT BEDTIME AS NEEDED FOR INSOMNIA, Disp: 90 tablet, Rfl: 1 ? ?EXAM: ? ?VITALS per patient if applicable: ? ?GENERAL: alert, oriented, appears well and in no acute distress ? ?HEENT: atraumatic, conjunttiva clear, no obvious abnormalities on inspection of external nose and ears ? ?NECK: normal movements of the head and neck ? ?LUNGS: on inspection no signs of respiratory distress, breathing rate appears normal, no obvious gross SOB, gasping or  wheezing ? ?CV: no obvious cyanosis ? ?MS: moves all visible extremities without noticeable abnormality ? ?PSYCH/NEURO: pleasant and cooperative, no obvious depression or anxiety, speech and thought processing grossly intact ? ?ASSESSMENT AND PLAN: ? ?Discussed the following assessment and plan: ? ?Elevated liver enzymes ? ?Elevated liver enzymes ?Concern for autoimmune hepatitis given a her ositive ANA and elevated anti smooth muscle IgG antibody . Referring to GI . ultrasoudn ordered.  Repeat liver enzymes monthly until she is seen.  Recommended to avoid alcohol and tylenol.  ? ?  ?I discussed the assessment and treatment plan with the patient. The patient was provided an opportunity to ask questions and all were answered. The patient agreed with the plan and demonstrated an understanding of the instructions. ?  ?The patient was advised to call back or seek an in-person evaluation if the symptoms worsen or if the condition fails to improve as anticipated. ? ? ?I spent 30 minutes dedicated to the care of this patient on the date of this encounter to include pre-visit review of his medical history,  Face-to-face time with the patient , and post visit ordering of testing and therapeutics.  ? ? ?Sherlene Shams, MD   ?

## 2021-09-12 NOTE — Assessment & Plan Note (Addendum)
Concern for autoimmune hepatitis given a her ositive ANA and elevated anti smooth muscle IgG antibody . Referring to GI . ultrasoudn ordered.  Repeat liver enzymes monthly until she is seen.  Recommended to avoid alcohol and tylenol.  ?

## 2021-09-20 ENCOUNTER — Telehealth: Payer: Self-pay | Admitting: Gastroenterology

## 2021-09-20 NOTE — Telephone Encounter (Signed)
Good morning Dr. Lyndel Safe, ? ?(DoD 09/20/21, a.m.) ? ?This patient was referred by Dr. Derrel Nip for colonoscopy.  Patient's last colonoscopy was done by a general surgeon in July of 2015 (record in [place]).  She is requesting a transfer of care to [place].  Please let me know if you approve the transfer. ? ?Thank you. ?

## 2021-09-25 ENCOUNTER — Other Ambulatory Visit: Payer: Self-pay | Admitting: Internal Medicine

## 2021-09-25 DIAGNOSIS — Z1231 Encounter for screening mammogram for malignant neoplasm of breast: Secondary | ICD-10-CM

## 2021-09-25 NOTE — Telephone Encounter (Signed)
No problems by me ?Can work her into APP clinic or mine ?RG ?

## 2021-09-26 ENCOUNTER — Encounter: Payer: Self-pay | Admitting: Gastroenterology

## 2021-09-28 ENCOUNTER — Telehealth: Payer: Self-pay | Admitting: *Deleted

## 2021-09-28 NOTE — Telephone Encounter (Signed)
Please place future orders for lab appt.  

## 2021-10-01 NOTE — Telephone Encounter (Signed)
Pt aware and appt has been canceled. ?

## 2021-10-01 NOTE — Telephone Encounter (Signed)
No labs needed.  Done march 29 ? ?

## 2021-10-04 ENCOUNTER — Other Ambulatory Visit

## 2021-10-10 ENCOUNTER — Ambulatory Visit (INDEPENDENT_AMBULATORY_CARE_PROVIDER_SITE_OTHER): Admitting: Internal Medicine

## 2021-10-10 ENCOUNTER — Encounter: Payer: Self-pay | Admitting: Internal Medicine

## 2021-10-10 VITALS — BP 116/64 | HR 49 | Temp 97.7°F | Ht 62.0 in | Wt 134.8 lb

## 2021-10-10 DIAGNOSIS — Z0000 Encounter for general adult medical examination without abnormal findings: Secondary | ICD-10-CM

## 2021-10-10 DIAGNOSIS — T466X5A Adverse effect of antihyperlipidemic and antiarteriosclerotic drugs, initial encounter: Secondary | ICD-10-CM

## 2021-10-10 DIAGNOSIS — E7800 Pure hypercholesterolemia, unspecified: Secondary | ICD-10-CM | POA: Diagnosis not present

## 2021-10-10 DIAGNOSIS — M791 Myalgia, unspecified site: Secondary | ICD-10-CM

## 2021-10-10 DIAGNOSIS — R748 Abnormal levels of other serum enzymes: Secondary | ICD-10-CM

## 2021-10-10 LAB — COMPREHENSIVE METABOLIC PANEL
ALT: 19 U/L (ref 0–35)
AST: 26 U/L (ref 0–37)
Albumin: 4.5 g/dL (ref 3.5–5.2)
Alkaline Phosphatase: 84 U/L (ref 39–117)
BUN: 20 mg/dL (ref 6–23)
CO2: 30 mEq/L (ref 19–32)
Calcium: 9.5 mg/dL (ref 8.4–10.5)
Chloride: 101 mEq/L (ref 96–112)
Creatinine, Ser: 0.87 mg/dL (ref 0.40–1.20)
GFR: 70.44 mL/min (ref >60.00)
Glucose, Bld: 105 mg/dL — ABNORMAL HIGH (ref 70–99)
Potassium: 3.4 mEq/L — ABNORMAL LOW (ref 3.5–5.1)
Sodium: 138 mEq/L (ref 135–145)
Total Bilirubin: 0.7 mg/dL (ref 0.2–1.2)
Total Protein: 6.8 g/dL (ref 6.0–8.3)

## 2021-10-10 LAB — LIPID PANEL
Cholesterol: 255 mg/dL — ABNORMAL HIGH (ref 0–200)
HDL: 73.2 mg/dL (ref >39.00)
LDL Cholesterol: 165 mg/dL — ABNORMAL HIGH (ref 0–99)
NonHDL: 181.66
Total CHOL/HDL Ratio: 3
Triglycerides: 81 mg/dL (ref 0.0–149.0)
VLDL: 16.2 mg/dL (ref 0.0–40.0)

## 2021-10-10 LAB — CK: Total CK: 129 U/L (ref 7–177)

## 2021-10-10 MED ORDER — ALPRAZOLAM 0.5 MG PO TABS: 0.5000 mg | ORAL_TABLET | Freq: Every evening | ORAL | 5 refills | Status: DC | PRN

## 2021-10-10 NOTE — Progress Notes (Signed)
The patient is here for annual preventive examination and management of other chronic and acute problems. ?  ?The risk factors are reflected in the social history. ?  ?The roster of all physicians providing medical care to patient - is listed in the Snapshot section of the chart. ?  ?Activities of daily living:  The patient is 100% independent in all ADLs: dressing, toileting, feeding as well as independent mobility ?  ?Home safety : The patient has smoke detectors in the home. They wear seatbelts.  There are no unsecured firearms at home. There is no violence in the home.  ?  ?There is no risks for hepatitis, STDs or HIV. There is no   history of blood transfusion. They have no travel history to infectious disease endemic areas of the world. ?  ?The patient has seen their dentist in the last six month. They have seen their eye doctor in the last year. The patinet  denies slight hearing difficulty with regard to whispered voices and some television programs.  They have deferred audiologic testing in the last year.  They do not  have excessive sun exposure. Discussed the need for sun protection: hats, long sleeves and use of sunscreen if there is significant sun exposure.  ?  ?Diet: the importance of a healthy diet is discussed. They do have a healthy diet. ?  ?The benefits of regular aerobic exercise were discussed. The patient  exercises  5 days per week  for  60 minutes.  ?  ?Depression screen: there are no signs or vegative symptoms of depression- irritability, change in appetite, anhedonia, sadness/tearfullness. ?  ?The following portions of the patient's history were reviewed and updated as appropriate: allergies, current medications, past family history, past medical history,  past surgical history, past social history  and problem list. ?  ?Visual acuity was not assessed per patient preference since the patient has regular follow up with an  ophthalmologist. Hearing and body mass index were assessed and  reviewed.  ?  ?During the course of the visit the patient was educated and counseled about appropriate screening and preventive services including : fall prevention , diabetes screening, nutrition counseling, colorectal cancer screening, and recommended immunizations.   ? ?Chief Complaint: ? ?1) follow up on elevated liver enzymes : noticed in February while taking statin  and several  supplements related to workouts. ,XTEND preworkout powder, zinc, and magnesium .  She has suspended them all since last visit.  Reviewed her positive ANA ; she has been referred to GI and has an appt in the next several weeks.  She is asymptomatic   ? ?  ?Review of Symptoms ? ?Patient denies headache, fevers, malaise, unintentional weight loss, skin rash, eye pain, sinus congestion and sinus pain, sore throat, dysphagia,  hemoptysis , cough, dyspnea, wheezing, chest pain, palpitations, orthopnea, edema, abdominal pain, nausea, melena, diarrhea, constipation, flank pain, dysuria, hematuria, urinary  Frequency, nocturia, numbness, tingling, seizures,  Focal weakness, Loss of consciousness,  Tremor, insomnia, depression, anxiety, and suicidal ideation.   ? ?Physical Exam: ? ?BP 116/64 (BP Location: Left Arm, Patient Position: Sitting, Cuff Size: Normal)   Pulse (!) 49   Temp 97.7 ?F (36.5 ?C) (Oral)   Ht 5\' 2"  (1.575 m)   Wt 134 lb 12.8 oz (61.1 kg)   SpO2 96%   BMI 24.66 kg/m?   ? ?General appearance: alert, cooperative and appears stated age ?Head: Normocephalic, without obvious abnormality, atraumatic ?Eyes: conjunctivae/corneas clear. PERRL, EOM's intact. Fundi benign. ?Ears:  normal TM's and external ear canals both ears ?Nose: Nares normal. Septum midline. Mucosa normal. No drainage or sinus tenderness. ?Throat: lips, mucosa, and tongue normal; teeth and gums normal ?Neck: no adenopathy, no carotid bruit, no JVD, supple, symmetrical, trachea midline and thyroid not enlarged, symmetric, no tenderness/mass/nodules ?Lungs: clear  to auscultation bilaterally ?Breasts: normal appearance, no masses or tenderness ?Heart: regular rate and rhythm, S1, S2 normal, no murmur, click, rub or gallop ?Abdomen: soft, non-tender; bowel sounds normal; no masses,  no organomegaly ?Extremities: extremities normal, atraumatic, no cyanosis or edema ?Pulses: 2+ and symmetric ?Skin: Skin color, texture, turgor normal. No rashes or lesions ?Neurologic: Alert and oriented X 3, normal strength and tone. Normal symmetric reflexes. Normal coordination and gait.    ? ? ?Assessment and Plan: ? ?Elevated liver enzymes ?Her positive ANA titers of 1:320 in a nuclear speckled pattern,  And 1:80  nuclear homogenous patterns may indicate a drug reaction, also suggested by normalization of AST/ALT after suspending statin and workout supplement.  Her workout supplements have been reviewed and are benign, will d/c  statin  ? ?Encounter for preventive health examination ?age appropriate education and counseling updated, referrals for preventative services and immunizations addressed, dietary and smoking counseling addressed, most recent labs reviewed.  I have personally reviewed and have noted: ?  ?1) the patient's medical and social history ?2) The pt's use of alcohol, tobacco, and illicit drugs ?3) The patient's current medications and supplements ?4) Functional ability including ADL's, fall risk, home safety risk, hearing and visual impairment ?5) Diet and physical activities ?6) Evidence for depression or mood disorder ?7) The patient's height, weight, and BMI have been recorded in the chartI have made referrals, and provided counseling and education based on review of the above ? ?Hyperlipidemia ?Repeat lipids without simvastatin have been reviewed  ; even with concurrent hypertension,  Her 10 yr risk using the [facility] calculator is 5 % .  Will not resume statin until risk is > 10% ? ? ?Lab Results  ?Component Value Date  ? CHOL 255 (H) 10/10/2021  ? HDL 73.20 10/10/2021  ?  LDLCALC 165 (H) 10/10/2021  ? TRIG 81.0 10/10/2021  ? CHOLHDL 3 10/10/2021  ? ? ?Myalgia due to statin ?Elevated liver enzymes resolved with suspension of simvastatin.  ANA pattern suggested drug effect  ? ? ?Updated Medication List ?Outpatient Encounter Medications as of 10/10/2021  ?Medication Sig  ? acyclovir (ZOVIRAX) 400 MG tablet TAKE 1 TABLET BY MOUTH 5 TIMES DAILY FOR 5 DAYS  ? escitalopram (LEXAPRO) 10 MG tablet TAKE 1 TABLET BY MOUTH EVERY DAY  ? hydrochlorothiazide (HYDRODIURIL) 25 MG tablet TAKE 1 TABLET BY MOUTH EVERY DAY  ? Multiple Vitamins-Minerals (MULTIVITAMIN WITH MINERALS) tablet Take 1 tablet by mouth daily.  ? telmisartan (MICARDIS) 40 MG tablet TAKE 1 TABLET BY MOUTH EVERY DAY  ? traZODone (DESYREL) 50 MG tablet TAKE 1 TABLET BY MOUTH EVERY DAY AT BEDTIME AS NEEDED FOR INSOMNIA  ? [DISCONTINUED] ALPRAZolam (XANAX) 0.5 MG tablet TAKE ONE TABLET BY MOUTH EVERY DAY AS NEEDED FOR ANXIETY  ? [DISCONTINUED] simvastatin (ZOCOR) 20 MG tablet TAKE 1 TABLET BY MOUTH EVERYDAY AT BEDTIME  ? ALPRAZolam (XANAX) 0.5 MG tablet Take 1 tablet (0.5 mg total) by mouth at bedtime as needed for anxiety.  ? ?No facility-administered encounter medications on file as of 10/10/2021.  ? ? ?

## 2021-10-10 NOTE — Patient Instructions (Addendum)
Continue melatonin gummy bears  daily after dinner to help your sleep cycle ? ?Continue lexapro daily because it manages anxiety, depression and hot  flashes  ? ?Use the alprazolam only as needed because it active e with daily longterm use  ? ?Do not resume pre workout powder or statin until you hear from me ? ?Send me a screenshot of the powder ingredients  ? ?Your mammogram is scheduled for May 22,  and the GI appt with Dr Chales Abrahams is May 23  ? ? ?

## 2021-10-11 ENCOUNTER — Encounter: Payer: Self-pay | Admitting: Internal Medicine

## 2021-10-12 DIAGNOSIS — T466X5A Adverse effect of antihyperlipidemic and antiarteriosclerotic drugs, initial encounter: Secondary | ICD-10-CM | POA: Insufficient documentation

## 2021-10-12 NOTE — Assessment & Plan Note (Addendum)
Repeat lipids without simvastatin have been reviewed  ; even with concurrent hypertension,  Her 10 yr risk using the [facility] calculator is 5 % .  Will not resume statin until risk is > 10% ? ? ?Lab Results  ?Component Value Date  ? CHOL 255 (H) 10/10/2021  ? HDL 73.20 10/10/2021  ? LDLCALC 165 (H) 10/10/2021  ? TRIG 81.0 10/10/2021  ? CHOLHDL 3 10/10/2021  ? ?

## 2021-10-12 NOTE — Assessment & Plan Note (Addendum)
Her positive ANA titers of 1:320 in a nuclear speckled pattern,  And 1:80  nuclear homogenous patterns may indicate a drug reaction, also suggested by normalization of AST/ALT after suspending statin and workout supplement.  Her workout supplements have been reviewed and are benign, will d/c  statin  ?

## 2021-10-12 NOTE — Assessment & Plan Note (Signed)
Elevated liver enzymes resolved with suspension of simvastatin.  ANA pattern suggested drug effect  ?

## 2021-10-12 NOTE — Assessment & Plan Note (Signed)

## 2021-10-29 ENCOUNTER — Ambulatory Visit
Admission: RE | Admit: 2021-10-29 | Discharge: 2021-10-29 | Disposition: A | Discharge status: Home / Self Care | Source: Ambulatory Visit | Attending: Internal Medicine | Admitting: Internal Medicine

## 2021-10-29 DIAGNOSIS — Z1231 Encounter for screening mammogram for malignant neoplasm of breast: Secondary | ICD-10-CM | POA: Diagnosis present

## 2021-10-30 ENCOUNTER — Ambulatory Visit (INDEPENDENT_AMBULATORY_CARE_PROVIDER_SITE_OTHER): Admitting: Gastroenterology

## 2021-10-30 ENCOUNTER — Encounter: Payer: Self-pay | Admitting: Gastroenterology

## 2021-10-30 VITALS — BP 122/72 | HR 58 | Ht 62.0 in | Wt 134.5 lb

## 2021-10-30 DIAGNOSIS — R7989 Other specified abnormal findings of blood chemistry: Secondary | ICD-10-CM

## 2021-10-30 NOTE — Patient Instructions (Signed)
If you are age 65 or older, your body mass index should be between 23-30. Your Body mass index is 24.6 kg/m. If this is out of the aforementioned range listed, please consider follow up with your Primary Care Provider.  If you are age 33 or younger, your body mass index should be between 19-25. Your Body mass index is 24.6 kg/m. If this is out of the aformentioned range listed, please consider follow up with your Primary Care Provider.   ________________________________________________________  The [place] [facility] providers would like to encourage you to use [facility] to communicate with providers for non-urgent requests or questions.  Due to long hold times on the telephone, sending your provider a message by [facility] may be a faster and more efficient way to get a response.  Please allow 48 business hours for a response.  Please remember that this is for non-urgent requests.  _______________________________________________________  Bonita Quin have been scheduled for an abdominal ultrasound at [facility] on 11-07-2021 at 830am. Please arrive 15 minutes prior to your appointment for registration. Make certain not to have anything to eat or drink midnight prior to your appointment. Should you need to reschedule your appointment, please contact radiology at [phone]. This test typically takes about 30 minutes to perform.  Repeat lab work in 3 months. Please come to [address] [place] after 01-30-2022 to get lab work done  Repeat colonoscopy 12-2023. A letter will be mailed as it gets closer but you can call 2 months in advance to schedule  Please call with any questions or concerns.  Thank you,  Dr. Lynann Bologna

## 2021-10-30 NOTE — Progress Notes (Signed)
Chief Complaint: Abnormal LFTs  Referring Provider:  Sherlene Shams, MD      ASSESSMENT AND PLAN;   #1. Abn LFTs (resolved). Likely d/t simvastatin. Incidental +ASMA but no features of autoimmune hepatitis currently.  Her LFTs have normalized.  I do not recommend liver Bx at this time.  #2. Colorectal cancer screening. Neg colon 12/2013  Plan: -[place] abdo @ [place] -LFTs in 12 weeks. -Recall Colon 12/2023 -Call if any problems.  I have reassured pt.   HPI:    Michelle Mahoney is a 66 y.o. female   With abn AST/ALT as below 39/47 08/2021, when simvastatin was started Now normalized 26/19 10/2021 (see labs below) After stopping simvastatin  No H/O itching, skin lesions, easy bruisability, intake of OTC meds including diet pills, herbal medications, anabolic steroids or Tylenol. There is no H/O blood transfusions, IVDA or FH of liver disease. No jaundice, dark urine or pale stools. No alcohol abuse.  No nausea, vomiting, heartburn, regurgitation, odynophagia or dysphagia.  No significant diarrhea or constipation.  No melena or hematochezia. No unintentional weight loss. No abdominal pain.  She does have frequent bowel movements since she is on high-fiber diet.  She exercises regularly.  Had extensive liver work-up -Anti-smooth muscle antibody 36 (N <20),  -neg ENA SM Ab Ser-aCnc, neg LKM-1 -Normal alpha-1 antitrypsin, Nl ceruloplasmin -ANA + 1:320, rpt 1:80, nuclear homogeneous -Neg AMA, iron studies, neg acute hepatitis panel    Wt Readings from Last 3 Encounters:  10/30/21 134 lb 8 oz (61 kg)  10/10/21 134 lb 12.8 oz (61.1 kg)  09/12/21 131 lb (59.4 kg)    Past Medical History:  Diagnosis Date   Allergy    pollen    Chicken pox    Sinus bradycardia     Past Surgical History:  Procedure Laterality Date   ABDOMINAL HYSTERECTOMY  2001   LESION EXCISION  2008   anal area    Family History  Problem Relation Age of Onset   Cancer Mother 23       melanoma    Stroke Mother 68       secondary to septic emboli spine infection   Heart disease Father 51       Heart failure   Mental illness Father 26       dementia   Breast cancer Neg Hx    Colon cancer Neg Hx    Rectal cancer Neg Hx    Stomach cancer Neg Hx     Social History   Tobacco Use   Smoking status: Never   Smokeless tobacco: Never  Vaping Use   Vaping Use: Never used  Substance Use Topics   Alcohol use: Not Currently    Comment: occ   Drug use: No    Current Outpatient Medications  Medication Sig Dispense Refill   acyclovir (ZOVIRAX) 400 MG tablet TAKE 1 TABLET BY MOUTH 5 TIMES DAILY FOR 5 DAYS     ALPRAZolam (XANAX) 0.5 MG tablet Take 1 tablet (0.5 mg total) by mouth at bedtime as needed for anxiety. 30 tablet 5   AMBULATORY NON FORMULARY MEDICATION Medication Name: Prevager once daily     escitalopram (LEXAPRO) 10 MG tablet TAKE 1 TABLET BY MOUTH EVERY DAY 90 tablet 1   hydrochlorothiazide (HYDRODIURIL) 25 MG tablet TAKE 1 TABLET BY MOUTH EVERY DAY 90 tablet 3   telmisartan (MICARDIS) 40 MG tablet TAKE 1 TABLET BY MOUTH EVERY DAY 90 tablet 1   traZODone (DESYREL) 50  MG tablet TAKE 1 TABLET BY MOUTH EVERY DAY AT BEDTIME AS NEEDED FOR INSOMNIA 90 tablet 1   Zinc 50 MG CAPS Take by mouth. 1 capsule daily     No current facility-administered medications for this visit.    Allergies  Allergen Reactions   Simvastatin Other (See Comments)    Elevated liver enzymes    Review of Systems:  Constitutional: Denies fever, chills, diaphoresis, appetite change and fatigue.  HEENT: Denies photophobia, eye pain, redness, hearing loss, ear pain, congestion, sore throat, rhinorrhea, sneezing, mouth sores, neck pain, neck stiffness and tinnitus.   Respiratory: Denies SOB, DOE, cough, chest tightness,  and wheezing.   Cardiovascular: Denies chest pain, palpitations and leg swelling.  Genitourinary: Denies dysuria, urgency, frequency, hematuria, flank pain and difficulty urinating.   Musculoskeletal: Denies myalgias, back pain, joint swelling, arthralgias and gait problem.  Skin: No rash.  Neurological: Denies dizziness, seizures, syncope, weakness, light-headedness, numbness and headaches.  Hematological: Denies adenopathy. Easy bruising, personal or family bleeding history  Psychiatric/Behavioral: No anxiety or depression     Physical Exam:    BP 122/72   Pulse (!) 58   Ht 5\' 2"  (1.575 m)   Wt 134 lb 8 oz (61 kg)   SpO2 97%   BMI 24.60 kg/m  Wt Readings from Last 3 Encounters:  10/30/21 134 lb 8 oz (61 kg)  10/10/21 134 lb 12.8 oz (61.1 kg)  09/12/21 131 lb (59.4 kg)   Constitutional:  Well-developed, in no acute distress. Psychiatric: Normal mood and affect. Behavior is normal. HEENT: Pupils normal.  Conjunctivae are normal. No scleral icterus. Cardiovascular: Normal rate, regular rhythm. No edema Pulmonary/chest: Effort normal and breath sounds normal. No wheezing, rales or rhonchi. Abdominal: Soft, nondistended. Nontender. Bowel sounds active throughout. There are no masses palpable. No hepatomegaly. Rectal: Deferred Neurological: Alert and oriented to person place and time. Skin: Skin is warm and dry. No rashes noted.  Data Reviewed: I have personally reviewed following labs and imaging studies  CBC:    Latest Ref Rng & Units 12/18/2017   11:13 AM 04/02/2017    1:59 PM 12/22/2014   11:27 AM  CBC  WBC 3.6 - 11.0 K/uL 5.5   6.1   6.0    Hemoglobin 12.0 - 16.0 g/dL 12/24/2014   40.9   81.1    Hematocrit 35.0 - 47.0 % 41.5   44.0   43.5    Platelets 150 - 440 K/uL 213   272.0   255.0      CMP:    Latest Ref Rng & Units 10/10/2021    9:39 AM 08/22/2021    3:57 PM 07/25/2021    3:22 PM  CMP  Glucose 70 - 99 mg/dL 07/27/2021   88   91    BUN 6 - 23 mg/dL 20   22   25     Creatinine 0.40 - 1.20 mg/dL 782     9.56    Sodium 135 - 145 mEq/L 138   138   140    Potassium 3.5 - 5.1 mEq/L 3.4   3.4   3.5    Chloride 96 - 112 mEq/L 101   101   101    CO2 19  - 32 mEq/L 30   29   35    Calcium 8.4 - 10.5 mg/dL 9.5   9.3   9.8    Total Protein 6.0 - 8.3 g/dL 6.8   6.3   6.4  Total Bilirubin 0.2 - 1.2 mg/dL 0.7   0.5   0.6    Alkaline Phos 39 - 117 U/L 84   76   75    AST 0 - 37 U/L 26   39   43    ALT 0 - 35 U/L 19   47   43         Edman Circleaj Samrat Hayward, MD 10/30/2021, 2:48 PM  Cc: Sherlene Shamsullo, Teresa L, MD

## 2021-11-07 ENCOUNTER — Ambulatory Visit
Admission: RE | Admit: 2021-11-07 | Discharge: 2021-11-07 | Disposition: A | Discharge status: Home / Self Care | Source: Ambulatory Visit | Attending: Gastroenterology | Admitting: Gastroenterology

## 2021-11-07 DIAGNOSIS — R7989 Other specified abnormal findings of blood chemistry: Secondary | ICD-10-CM | POA: Insufficient documentation

## 2021-11-24 ENCOUNTER — Other Ambulatory Visit: Payer: Self-pay | Admitting: Internal Medicine

## 2021-12-27 ENCOUNTER — Other Ambulatory Visit: Payer: Self-pay | Admitting: Internal Medicine

## 2022-01-04 ENCOUNTER — Other Ambulatory Visit: Payer: Self-pay | Admitting: Internal Medicine

## 2022-01-15 ENCOUNTER — Ambulatory Visit: Admitting: Gastroenterology

## 2022-01-29 ENCOUNTER — Ambulatory Visit: Admitting: Gastroenterology

## 2022-02-01 ENCOUNTER — Telehealth: Payer: Self-pay | Admitting: *Deleted

## 2022-02-01 NOTE — Telephone Encounter (Signed)
Please place future orders for lab appt.  

## 2022-02-04 ENCOUNTER — Telehealth: Payer: Self-pay

## 2022-02-04 ENCOUNTER — Other Ambulatory Visit (INDEPENDENT_AMBULATORY_CARE_PROVIDER_SITE_OTHER)

## 2022-02-04 DIAGNOSIS — R748 Abnormal levels of other serum enzymes: Secondary | ICD-10-CM

## 2022-02-04 DIAGNOSIS — R5383 Other fatigue: Secondary | ICD-10-CM | POA: Diagnosis not present

## 2022-02-04 DIAGNOSIS — E7800 Pure hypercholesterolemia, unspecified: Secondary | ICD-10-CM

## 2022-02-04 LAB — COMPREHENSIVE METABOLIC PANEL
ALT: 15 U/L (ref 0–35)
AST: 20 U/L (ref 0–37)
Albumin: 4.4 g/dL (ref 3.5–5.2)
Alkaline Phosphatase: 77 U/L (ref 39–117)
BUN: 12 mg/dL (ref 6–23)
CO2: 29 mEq/L (ref 19–32)
Calcium: 9.5 mg/dL (ref 8.4–10.5)
Chloride: 100 mEq/L (ref 96–112)
Creatinine, Ser: 0.83 mg/dL (ref 0.40–1.20)
GFR: 74.36 mL/min (ref >60.00)
Glucose, Bld: 109 mg/dL — ABNORMAL HIGH (ref 70–99)
Potassium: 3.5 mEq/L (ref 3.5–5.1)
Sodium: 139 mEq/L (ref 135–145)
Total Bilirubin: 0.6 mg/dL (ref 0.2–1.2)
Total Protein: 6.5 g/dL (ref 6.0–8.3)

## 2022-02-04 LAB — LIPID PANEL
Cholesterol: 242 mg/dL — ABNORMAL HIGH (ref 0–200)
HDL: 69.7 mg/dL (ref >39.00)
LDL Cholesterol: 153 mg/dL — ABNORMAL HIGH (ref 0–99)
NonHDL: 172.25
Total CHOL/HDL Ratio: 3
Triglycerides: 98 mg/dL (ref 0.0–149.0)
VLDL: 19.6 mg/dL (ref 0.0–40.0)

## 2022-02-04 LAB — TSH: TSH: 3.15 u[IU]/mL (ref 0.35–5.50)

## 2022-02-04 NOTE — Telephone Encounter (Signed)
Labs have been ordered

## 2022-02-08 ENCOUNTER — Other Ambulatory Visit: Payer: Self-pay | Admitting: Internal Medicine

## 2022-04-30 ENCOUNTER — Other Ambulatory Visit: Payer: Self-pay | Admitting: Internal Medicine

## 2022-05-01 NOTE — Telephone Encounter (Signed)
Refilled: 10/10/2021 Last OV: 10/10/2021 Next OV: not scheduled

## 2022-05-09 ENCOUNTER — Telehealth: Payer: Self-pay | Admitting: Internal Medicine

## 2022-05-09 ENCOUNTER — Other Ambulatory Visit: Payer: Self-pay | Admitting: Internal Medicine

## 2022-05-09 DIAGNOSIS — E7800 Pure hypercholesterolemia, unspecified: Secondary | ICD-10-CM

## 2022-05-09 DIAGNOSIS — R748 Abnormal levels of other serum enzymes: Secondary | ICD-10-CM

## 2022-05-09 DIAGNOSIS — R7301 Impaired fasting glucose: Secondary | ICD-10-CM

## 2022-05-09 NOTE — Telephone Encounter (Signed)
Patient has an appointment on 05/22/2022, patient would like to know if Dr Darrick Huntsman wants labs before appointment.

## 2022-05-09 NOTE — Telephone Encounter (Signed)
Spoke to pt and informed her that her fasting labs were ordered already.

## 2022-05-09 NOTE — Telephone Encounter (Addendum)
Patient sent a message to ask if she will need labs prior to her office visit on 12.13.23.    Michelle Mahoney  P [facility] (supporting Mychart, Generic)2 hours ago (7:48 AM)    Do I need to have blood work done?    Mychart, Generic  [name] days ago   GM Appointment Information:     Visit Type: Office Visit         Date: 05/22/2022                 Dept: [facility]                 Provider: Sherlene Shams                 Time: 3:30 PM                 Length: 30 min   Appt Status: Scheduled

## 2022-05-10 NOTE — Telephone Encounter (Signed)
Left detailed msg but pt is already scheduled 05/17/22 for lab.

## 2022-05-17 ENCOUNTER — Other Ambulatory Visit (INDEPENDENT_AMBULATORY_CARE_PROVIDER_SITE_OTHER): Payer: PPO

## 2022-05-17 DIAGNOSIS — E7800 Pure hypercholesterolemia, unspecified: Secondary | ICD-10-CM

## 2022-05-17 DIAGNOSIS — R7989 Other specified abnormal findings of blood chemistry: Secondary | ICD-10-CM

## 2022-05-17 DIAGNOSIS — R748 Abnormal levels of other serum enzymes: Secondary | ICD-10-CM

## 2022-05-17 DIAGNOSIS — R7301 Impaired fasting glucose: Secondary | ICD-10-CM

## 2022-05-17 LAB — HEPATIC FUNCTION PANEL
ALT: 10 U/L (ref 0–35)
AST: 18 U/L (ref 0–37)
Albumin: 4.4 g/dL (ref 3.5–5.2)
Alkaline Phosphatase: 71 U/L (ref 39–117)
Bilirubin, Direct: 0.1 mg/dL (ref 0.0–0.3)
Total Bilirubin: 0.4 mg/dL (ref 0.2–1.2)
Total Protein: 6.6 g/dL (ref 6.0–8.3)

## 2022-05-17 LAB — COMPREHENSIVE METABOLIC PANEL
ALT: 10 U/L (ref 0–35)
AST: 18 U/L (ref 0–37)
Albumin: 4.4 g/dL (ref 3.5–5.2)
Alkaline Phosphatase: 71 U/L (ref 39–117)
BUN: 16 mg/dL (ref 6–23)
CO2: 32 mEq/L (ref 19–32)
Calcium: 9.7 mg/dL (ref 8.4–10.5)
Chloride: 103 mEq/L (ref 96–112)
Creatinine, Ser: 0.84 mg/dL (ref 0.40–1.20)
GFR: 73.16 mL/min (ref >60.00)
Glucose, Bld: 108 mg/dL — ABNORMAL HIGH (ref 70–99)
Potassium: 3.7 mEq/L (ref 3.5–5.1)
Sodium: 141 mEq/L (ref 135–145)
Total Bilirubin: 0.4 mg/dL (ref 0.2–1.2)
Total Protein: 6.6 g/dL (ref 6.0–8.3)

## 2022-05-17 LAB — LIPID PANEL
Cholesterol: 240 mg/dL — ABNORMAL HIGH (ref 0–200)
HDL: 62.8 mg/dL (ref >39.00)
LDL Cholesterol: 158 mg/dL — ABNORMAL HIGH (ref 0–99)
NonHDL: 177.23
Total CHOL/HDL Ratio: 4
Triglycerides: 94 mg/dL (ref 0.0–149.0)
VLDL: 18.8 mg/dL (ref 0.0–40.0)

## 2022-05-17 LAB — HEMOGLOBIN A1C: Hgb A1c MFr Bld: 5.8 % (ref 4.6–6.5)

## 2022-05-22 ENCOUNTER — Ambulatory Visit (INDEPENDENT_AMBULATORY_CARE_PROVIDER_SITE_OTHER): Payer: PPO | Admitting: Internal Medicine

## 2022-05-22 ENCOUNTER — Encounter: Payer: Self-pay | Admitting: Internal Medicine

## 2022-05-22 VITALS — BP 130/76 | HR 54 | Temp 97.6°F | Ht 62.0 in | Wt 136.8 lb

## 2022-05-22 DIAGNOSIS — G2581 Restless legs syndrome: Secondary | ICD-10-CM

## 2022-05-22 DIAGNOSIS — F419 Anxiety disorder, unspecified: Secondary | ICD-10-CM | POA: Diagnosis not present

## 2022-05-22 DIAGNOSIS — I10 Essential (primary) hypertension: Secondary | ICD-10-CM

## 2022-05-22 DIAGNOSIS — T466X5A Adverse effect of antihyperlipidemic and antiarteriosclerotic drugs, initial encounter: Secondary | ICD-10-CM

## 2022-05-22 DIAGNOSIS — Z635 Disruption of family by separation and divorce: Secondary | ICD-10-CM

## 2022-05-22 DIAGNOSIS — M791 Myalgia, unspecified site: Secondary | ICD-10-CM | POA: Diagnosis not present

## 2022-05-22 DIAGNOSIS — F411 Generalized anxiety disorder: Secondary | ICD-10-CM

## 2022-05-22 DIAGNOSIS — F5105 Insomnia due to other mental disorder: Secondary | ICD-10-CM

## 2022-05-22 MED ORDER — HYDROXYZINE PAMOATE 25 MG PO CAPS: 25.0000 mg | ORAL_CAPSULE | Freq: Three times a day (TID) | ORAL | 5 refills | Status: DC | PRN

## 2022-05-22 NOTE — Progress Notes (Signed)
Subjective:  Patient ID: Michelle Mahoney, female    DOB: [birth year]  Age: 66 y.o. MRN: [id number]  CC: The primary encounter diagnosis was Anxiety state. Diagnoses of Myalgia due to statin, Insomnia secondary to anxiety, Restless legs syndrome, Marital estrangement, and Essential hypertension were also pertinent to this visit.   HPI MIKAELAH TROSTLE presents for follow u p on chronic issues Chief Complaint  Patient presents with   Medical Management of Chronic Issues    hypertension   1) HTN: she has benn taking telmsartan and hctz q am .  Patient is taking her medications as prescribed and notes no adverse effects.  Home BP readings have been done about once per week and are generally < 130/80 .  She is avoiding added salt in her diet and exercising  regularly about 3 times per week without chest pain or claudication symptoms .   2) RLS: not bothering her currently   3) GAD:  she is processing her recent divorce and the stress of maintaining self sufficiency.  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  4) INSOMNIA:  chronic , managed with multiple medications to avoid excalating use of ALPRAZOLAM, has added  melatonin AND TRAZODONE AT NIGHT   5) HLD:  she did not tolerate a trial of simvastatin due to myalgias which were recurrent when she suspended and resumed the medication  Outpatient Medications Prior to Visit  Medication Sig Dispense Refill   acyclovir (ZOVIRAX) 400 MG tablet TAKE 1 TABLET BY MOUTH 5 TIMES DAILY FOR 5 DAYS     ALPRAZolam (XANAX) 0.5 MG tablet TAKE ONE TABLET AT BEDTIME AS NEEDED FOR ANXIETY 30 tablet 0   AMBULATORY NON FORMULARY MEDICATION Medication Name: Prevager once daily     escitalopram (LEXAPRO) 10 MG tablet TAKE 1 TABLET BY MOUTH EVERY DAY 90 tablet 1   hydrochlorothiazide (HYDRODIURIL) 25 MG tablet TAKE 1 TABLET BY MOUTH EVERY DAY 90 tablet 3    telmisartan (MICARDIS) 40 MG tablet TAKE 1 TABLET BY MOUTH EVERY DAY 90 tablet 1   traZODone (DESYREL) 50 MG tablet TAKE 1 TABLET BY MOUTH AT BEDTIME AS NEEDED FOR INSOMNIA 90 tablet 1   Zinc 50 MG CAPS Take by mouth. 1 capsule daily (Patient not taking: Reported on 05/22/2022)     No facility-administered medications prior to visit.    Review of Systems;  Patient denies headache, fevers, malaise, unintentional weight loss, skin rash, eye pain, sinus congestion and sinus pain, sore throat, dysphagia,  hemoptysis , cough, dyspnea, wheezing, chest pain, palpitations, orthopnea, edema, abdominal pain, nausea, melena, diarrhea, constipation, flank pain, dysuria, hematuria, urinary  Frequency, nocturia, numbness, tingling, seizures,  Focal weakness, Loss of consciousness,  Tremor depression,and suicidal ideation.      Objective:  BP 130/76   Pulse (!) 54   Temp 97.6 F (36.4 C) (Oral)   Ht 5\' 2"  (1.575 m)   Wt 136 lb 12.8 oz (62.1 kg)   SpO2 96%   BMI 25.02 kg/m   BP Readings from Last 3 Encounters:  05/22/22 130/76  10/30/21 122/72  10/10/21 116/64    Wt Readings from Last 3 Encounters:  05/22/22 136 lb 12.8 oz (62.1 kg)  10/30/21 134 lb 8 oz (61 kg)  10/10/21 134 lb 12.8 oz (61.1 kg)    Physical Exam Vitals reviewed.  Constitutional:      General: She is  not in acute distress.    Appearance: Normal appearance. She is normal weight. She is not ill-appearing, toxic-appearing or diaphoretic.  HENT:     Head: Normocephalic.  Eyes:     General: No scleral icterus.       Right eye: No discharge.        Left eye: No discharge.     Conjunctiva/sclera: Conjunctivae normal.  Cardiovascular:     Rate and Rhythm: Normal rate and regular rhythm.     Heart sounds: Normal heart sounds.  Pulmonary:     Effort: Pulmonary effort is normal. No respiratory distress.     Breath sounds: Normal breath sounds.  Musculoskeletal:        General: Normal range of motion.  Skin:     General: Skin is warm and dry.  Neurological:     General: No focal deficit present.     Mental Status: She is alert and oriented to person, place, and time. Mental status is at baseline.  Psychiatric:        Mood and Affect: Mood normal.        Behavior: Behavior normal.        Thought Content: Thought content normal.        Judgment: Judgment normal.     Lab Results  Component Value Date   HGBA1C 5.8 05/17/2022   HGBA1C 5.4 12/22/2014    Lab Results  Component Value Date   CREATININE 0.84 05/17/2022   CREATININE 0.83 02/04/2022   CREATININE 0.87 10/10/2021    Lab Results  Component Value Date   WBC 5.5 12/18/2017   HGB 14.5 12/18/2017   HCT 41.5 12/18/2017   PLT 213 12/18/2017   GLUCOSE 108 (H) 05/17/2022   CHOL 240 (H) 05/17/2022   TRIG 94.0 05/17/2022   HDL 62.80 05/17/2022   LDLCALC 158 (H) 05/17/2022   ALT 10 05/17/2022   ALT 10 05/17/2022   AST 18 05/17/2022   AST 18 05/17/2022   NA 141 05/17/2022   K 3.7 05/17/2022   CL 103 05/17/2022   CREATININE 0.84 05/17/2022   BUN 16 05/17/2022   CO2 32 05/17/2022   TSH 3.15 02/04/2022   HGBA1C 5.8 05/17/2022   MICROALBUR <0.7 07/25/2021    US Abdomen Complete  Result Date: 11/07/2021 CLINICAL DATA:  Abnormal LFTs. EXAM: ABDOMEN ULTRASOUND COMPLETE COMPARISON:  None Available. FINDINGS: Gallbladder: No gallstones or wall thickening visualized. No sonographic Murphy sign noted by sonographer. Common bile duct: Diameter: 4 mm Liver: No focal lesion identified. Within normal limits in parenchymal echogenicity. Portal vein is patent on color Doppler imaging with normal direction of blood flow towards the liver. IVC: No abnormality visualized. Pancreas: Visualized portion unremarkable. Spleen: Size and appearance within normal limits. Right Kidney: Length: 10.4 cm. Echogenicity within normal limits. No mass or hydronephrosis visualized. Left Kidney: Length: 10.3 cm. Echogenicity within normal limits. No mass or  hydronephrosis visualized. Abdominal aorta: No aneurysm visualized. Other findings: None. IMPRESSION: No cholelithiasis or sonographic evidence for acute cholecystitis. No acute process. Electronically Signed   By: Annia Belt M.D.   On: 11/07/2021 20:37    Assessment & Plan:  .Anxiety state Assessment & Plan: Reviewed the risks of alprazolam use and the need to limit use to once daily .  Given her need to control her mood around her grandchildren, I have advised her to stop using alprazolam at night and to use hydroxyzine instead for insomia; reserve the alprazolam for daytime prn use for stress/anger .  The risks a Chronic  alprazolam  use were discussed with patient today including increased risk of dementia and addiction,  and to continue  lexapro use daily    Myalgia due to statin Assessment & Plan: Occurred with  simvastatin   Insomnia secondary to anxiety Assessment & Plan: Encourged to wean off alprazolam and add hydroxyzine to regimen of melatonin and trazodone    Restless legs syndrome Assessment & Plan: Managed with tizanidine prn .  Has not been problematic lately   Marital estrangement Assessment & Plan: Her divorce has finalized.   Essential hypertension Assessment & Plan: she reports compliance with telmisartan and hctz .  She has been asked to continue checking  her BP at me and  submit readings for evaluation.    Other orders -     hydrOXYzine Pamoate; Take 1 capsule (25 mg total) by mouth every 8 (eight) hours as needed for anxiety.  Dispense: 30 capsule; Refill: 5     I provided 30 minutes of face-to-face time during this encounter reviewing patient's last visit with me, patient's  most recent visit with cardiology,  nephrology,  and neurology,  recent surgical and non surgical procedures, previous  labs and imaging studies, counseling on currently addressed issues,  and post visit ordering to diagnostics and therapeutics .   Follow-up: Return in about 6  months (around 11/21/2022) for physical.   Sherlene Shams, MD

## 2022-05-22 NOTE — Patient Instructions (Addendum)
Merry Christmas!!!  You  do not need to resume any simvastatin   Check BP once a week; let me know if readings are > 130/80  Stop using alprazolam at night.  Use the  trazodone  melatonin and the new med called hydroxyzine   Then you can use the alprazolam as needed when frustrated  or angry

## 2022-05-25 NOTE — Assessment & Plan Note (Signed)
Encourged to wean off alprazolam and add hydroxyzine to regimen of melatonin and trazodone

## 2022-05-25 NOTE — Assessment & Plan Note (Addendum)
Reviewed the risks of alprazolam use and the need to limit use to once daily .  Given her need to control her mood around her grandchildren, I have advised her to stop using alprazolam at night and to use hydroxyzine instead for insomia; reserve the alprazolam for daytime prn use for stress/anger .  The risks a Chronic  alprazolam  use were discussed with patient today including increased risk of dementia and addiction,  and to continue  lexapro use daily

## 2022-05-25 NOTE — Assessment & Plan Note (Signed)
Managed with tizanidine prn .  Has not been problematic lately

## 2022-05-25 NOTE — Assessment & Plan Note (Signed)
Occurred with  simvastatin

## 2022-05-25 NOTE — Assessment & Plan Note (Signed)
Her divorce has finalized.

## 2022-05-25 NOTE — Assessment & Plan Note (Signed)
she reports compliance with telmisartan and hctz .  She has been asked to continue checking  her BP at me and  submit readings for evaluation.

## 2022-05-27 ENCOUNTER — Encounter: Payer: Self-pay | Admitting: Internal Medicine

## 2022-06-11 ENCOUNTER — Encounter: Payer: Self-pay | Admitting: Internal Medicine

## 2022-06-17 ENCOUNTER — Encounter: Payer: Self-pay | Admitting: Family Medicine

## 2022-06-17 ENCOUNTER — Ambulatory Visit (INDEPENDENT_AMBULATORY_CARE_PROVIDER_SITE_OTHER): Payer: Medicare HMO | Admitting: Family Medicine

## 2022-06-17 VITALS — BP 132/82 | HR 56 | Temp 97.6°F | Ht 62.0 in | Wt 138.6 lb

## 2022-06-17 DIAGNOSIS — R059 Cough, unspecified: Secondary | ICD-10-CM | POA: Diagnosis not present

## 2022-06-17 MED ORDER — FLUTICASONE PROPIONATE 50 MCG/ACT NA SUSP: 2.0000 | Freq: Every day | NASAL | 0 refills | Status: DC

## 2022-06-17 MED ORDER — AMOXICILLIN 500 MG PO TABS: 500.0000 mg | ORAL_TABLET | Freq: Two times a day (BID) | ORAL | 0 refills | Status: AC

## 2022-06-17 MED ORDER — FAMOTIDINE 20 MG PO TABS: 20.0000 mg | ORAL_TABLET | Freq: Every day | ORAL | 0 refills | Status: DC

## 2022-06-17 MED ORDER — PROBIOTIC 250 MG PO CAPS: ORAL_CAPSULE | ORAL | 0 refills | Status: DC

## 2022-06-17 NOTE — Patient Instructions (Addendum)
It was a pleasure meeting you today. Thank you for allowing me to take part in your health care.  Our goals for today as we discussed include:  I think likely your symptoms are related to seasonal allergies Recommend Flonase 1 spray twice daily Recommend Allegra 180 mg daily  Can start Pepcid 20 mg daily for possible reflux  Will send in prescription for amoxicillin 500 mg two times daily for 5 days Use only if increased fever, productive cough or worsening symptoms. Start probiotics daily while on antibiotics and continue for 14 days after.  Increase hydration  If you have any questions or concerns, please do not hesitate to call the office at [phone].  I look forward to our next visit and until then take care and stay safe.  Regards,   Carollee Leitz, MD   [facility]

## 2022-06-17 NOTE — Progress Notes (Signed)
SUBJECTIVE:   Chief Complaint  Patient presents with   Cough   HPI Patient presents to clinic with a cough x 2 weeks. Reports planning to go to [place] next week and would like biotics prior to leaving on trip.  Endorses similar symptoms yearly around this time.  Reports cough with productive and mostly at night and morning.  Denies any rhinorrhea, fevers, shortness of breath, wheezing, chest pain, nausea/vomiting, abdominal pain, diarrhea or recent sick contacts.  Endorses seasonal allergies.  Previously on Flonase but self discontinued as not think it was necessary.  Takes Zyrtec as needed.  Recently started hydroxyzine 25 mg at night for anxiety.  Cough started after initiation of medication however she has had this cough in the past.    PERTINENT PMH / PSH: Seasonal allergies Hypertension Anxiety  OBJECTIVE:  BP 132/82   Pulse (!) 56   Temp 97.6 F (36.4 C)   Ht 5\' 2"  (1.575 m)   Wt 138 lb 9.6 oz (62.9 kg)   SpO2 97%   BMI 25.35 kg/m    Physical Exam Vitals reviewed.  Constitutional:      General: She is not in acute distress.    Appearance: Normal appearance. She is not ill-appearing, toxic-appearing or diaphoretic.  HENT:     Right Ear: Tympanic membrane, ear canal and external ear normal. There is no impacted cerumen.     Left Ear: Tympanic membrane, ear canal and external ear normal. There is no impacted cerumen.     Nose: Nose normal. No congestion or rhinorrhea.     Mouth/Throat:     Mouth: Mucous membranes are moist.     Pharynx: Oropharynx is clear. No oropharyngeal exudate or posterior oropharyngeal erythema.  Eyes:     General:        Right eye: No discharge.        Left eye: No discharge.     Conjunctiva/sclera: Conjunctivae normal.  Cardiovascular:     Rate and Rhythm: Normal rate and regular rhythm.     Heart sounds: Normal heart sounds.  Pulmonary:     Effort: Pulmonary effort is normal.     Breath sounds: Normal breath sounds.  Musculoskeletal:         General: Normal range of motion.  Lymphadenopathy:     Cervical: No cervical adenopathy.  Skin:    General: Skin is warm and dry.  Neurological:     General: No focal deficit present.     Mental Status: She is alert and oriented to person, place, and time. Mental status is at baseline.  Psychiatric:        Mood and Affect: Mood normal.        Behavior: Behavior normal.        Thought Content: Thought content normal.        Judgment: Judgment normal.     ASSESSMENT/PLAN:  Cough, unspecified type Assessment & Plan: Unclear etiology.  Suspect multifactorial.  Low suspicion for viral etiology given no fevers and no worsening symptoms.  Considered GERD, asthma given nighttime and morning cough.  Also considered environmental component given symptoms nearly.   Trial Pepcid for GERD Trial of Flonase and Allegra for environmental allergies Will send in prescription for amoxicillin 500 mg twice daily x 5 days.  Discussed with patient to start only if increasing fevers, or worsening symptoms.  Do not think this is necessary to start at this point in time.  Recommend that if she start antibiotics to and probiotics  daily while on antibiotics and continue for 2 weeks after completion of therapy.   Orders: -     Fluticasone Propionate; Place 2 sprays into both nostrils daily.  Dispense: 16 g; Refill: 0 -     Famotidine; Take 1 tablet (20 mg total) by mouth at bedtime.  Dispense: 30 tablet; Refill: 0 -     Amoxicillin; Take 1 tablet (500 mg total) by mouth 2 (two) times daily for 5 days.  Dispense: 10 tablet; Refill: 0 -     Probiotic; Take 1 tablet daily  Dispense: 60 capsule; Refill: 0   PDMP reviewed  Return if symptoms worsen or fail to improve, for PCP.  Carollee Leitz, MD

## 2022-06-17 NOTE — Assessment & Plan Note (Addendum)
Unclear etiology.  Suspect multifactorial.  Low suspicion for viral etiology given no fevers and no worsening symptoms.  Considered GERD, asthma given nighttime and morning cough.  Also considered environmental component given symptoms nearly.   Trial Pepcid for GERD Trial of Flonase and Allegra for environmental allergies Will send in prescription for amoxicillin 500 mg twice daily x 5 days.  Discussed with patient to start only if increasing fevers, or worsening symptoms.  Do not think this is necessary to start at this point in time.  Recommend that if she start antibiotics to and probiotics daily while on antibiotics and continue for 2 weeks after completion of therapy.

## 2022-06-18 ENCOUNTER — Encounter: Payer: Self-pay | Admitting: Internal Medicine

## 2022-06-20 ENCOUNTER — Other Ambulatory Visit: Payer: Self-pay | Admitting: Internal Medicine

## 2022-06-20 MED ORDER — PREDNISONE 10 MG PO TABS: ORAL_TABLET | ORAL | 0 refills | Status: DC

## 2022-06-20 NOTE — Progress Notes (Signed)
Prednisone sent to pharmacy

## 2022-06-29 ENCOUNTER — Other Ambulatory Visit: Payer: Self-pay | Admitting: Internal Medicine

## 2022-07-02 ENCOUNTER — Encounter: Payer: Self-pay | Admitting: Internal Medicine

## 2022-07-02 ENCOUNTER — Other Ambulatory Visit: Payer: Self-pay | Admitting: Internal Medicine

## 2022-07-03 ENCOUNTER — Other Ambulatory Visit: Payer: Self-pay

## 2022-07-03 MED ORDER — ALPRAZOLAM 0.5 MG PO TABS: ORAL_TABLET | ORAL | 5 refills | Status: DC

## 2022-07-03 NOTE — Telephone Encounter (Signed)
Alprazolam refilled for 6 months

## 2022-07-03 NOTE — Telephone Encounter (Signed)
SENT TO DR Derrel Nip FOR APPROVAL

## 2022-07-08 ENCOUNTER — Encounter: Payer: Self-pay | Admitting: Internal Medicine

## 2022-07-12 ENCOUNTER — Other Ambulatory Visit: Payer: Self-pay

## 2022-07-12 MED ORDER — HYDROXYZINE PAMOATE 25 MG PO CAPS
50.0000 mg | ORAL_CAPSULE | Freq: Three times a day (TID) | ORAL | 5 refills | Status: DC | PRN
Start: 2022-07-12 — End: 2023-07-15

## 2022-07-12 NOTE — Telephone Encounter (Signed)
Script updated, sent to pharmacy Per pharmacist, test claim goes through.

## 2022-07-12 NOTE — Telephone Encounter (Signed)
Pt called and I read the message to her and she states she is taking two pills now and need an updated prescription

## 2022-07-12 NOTE — Telephone Encounter (Signed)
Lm for pt to cb re : Hydroxyzine is covered HCTZ too early to fill - but covered

## 2022-07-29 DIAGNOSIS — M5137 Other intervertebral disc degeneration, lumbosacral region: Secondary | ICD-10-CM | POA: Diagnosis not present

## 2022-07-29 DIAGNOSIS — M50322 Other cervical disc degeneration at C5-C6 level: Secondary | ICD-10-CM | POA: Diagnosis not present

## 2022-07-29 DIAGNOSIS — M9903 Segmental and somatic dysfunction of lumbar region: Secondary | ICD-10-CM | POA: Diagnosis not present

## 2022-07-29 DIAGNOSIS — M50321 Other cervical disc degeneration at C4-C5 level: Secondary | ICD-10-CM | POA: Diagnosis not present

## 2022-07-29 DIAGNOSIS — M542 Cervicalgia: Secondary | ICD-10-CM | POA: Diagnosis not present

## 2022-07-29 DIAGNOSIS — M9904 Segmental and somatic dysfunction of sacral region: Secondary | ICD-10-CM | POA: Diagnosis not present

## 2022-07-29 DIAGNOSIS — M50323 Other cervical disc degeneration at C6-C7 level: Secondary | ICD-10-CM | POA: Diagnosis not present

## 2022-07-29 DIAGNOSIS — M7918 Myalgia, other site: Secondary | ICD-10-CM | POA: Diagnosis not present

## 2022-07-29 DIAGNOSIS — M9901 Segmental and somatic dysfunction of cervical region: Secondary | ICD-10-CM | POA: Diagnosis not present

## 2022-07-29 DIAGNOSIS — M5451 Vertebrogenic low back pain: Secondary | ICD-10-CM | POA: Diagnosis not present

## 2022-08-13 ENCOUNTER — Other Ambulatory Visit: Payer: Self-pay | Admitting: Internal Medicine

## 2022-08-14 ENCOUNTER — Telehealth: Payer: Self-pay | Admitting: Internal Medicine

## 2022-08-14 NOTE — Telephone Encounter (Signed)
Patient called and said her insurance [employer] called and told her she needed to have a medicare visit. Does she need the welcome to medicare visit? And if so does it need to be with Dr Derrel Nip?

## 2022-08-15 DIAGNOSIS — B001 Herpesviral vesicular dermatitis: Secondary | ICD-10-CM | POA: Diagnosis not present

## 2022-08-15 DIAGNOSIS — D225 Melanocytic nevi of trunk: Secondary | ICD-10-CM | POA: Diagnosis not present

## 2022-08-15 DIAGNOSIS — L814 Other melanin hyperpigmentation: Secondary | ICD-10-CM | POA: Diagnosis not present

## 2022-08-15 DIAGNOSIS — X32XXXA Exposure to sunlight, initial encounter: Secondary | ICD-10-CM | POA: Diagnosis not present

## 2022-08-15 DIAGNOSIS — L821 Other seborrheic keratosis: Secondary | ICD-10-CM | POA: Diagnosis not present

## 2022-08-15 DIAGNOSIS — D2261 Melanocytic nevi of right upper limb, including shoulder: Secondary | ICD-10-CM | POA: Diagnosis not present

## 2022-08-15 DIAGNOSIS — D2272 Melanocytic nevi of left lower limb, including hip: Secondary | ICD-10-CM | POA: Diagnosis not present

## 2022-08-15 DIAGNOSIS — L298 Other pruritus: Secondary | ICD-10-CM | POA: Diagnosis not present

## 2022-08-15 DIAGNOSIS — D2262 Melanocytic nevi of left upper limb, including shoulder: Secondary | ICD-10-CM | POA: Diagnosis not present

## 2022-08-15 DIAGNOSIS — D2271 Melanocytic nevi of right lower limb, including hip: Secondary | ICD-10-CM | POA: Diagnosis not present

## 2022-08-15 NOTE — Telephone Encounter (Signed)
It looks like she is within her first year, therefore she needs a welcome to medicare visit with Dr. Derrel Nip.  Please schedule

## 2022-08-20 DIAGNOSIS — R69 Illness, unspecified: Secondary | ICD-10-CM | POA: Diagnosis not present

## 2022-08-28 ENCOUNTER — Ambulatory Visit (INDEPENDENT_AMBULATORY_CARE_PROVIDER_SITE_OTHER): Payer: Medicare HMO | Admitting: Family

## 2022-08-28 ENCOUNTER — Encounter: Payer: Self-pay | Admitting: Family

## 2022-08-28 VITALS — BP 128/82 | HR 81 | Temp 98.0°F | Ht 62.0 in | Wt 143.2 lb

## 2022-08-28 DIAGNOSIS — N3001 Acute cystitis with hematuria: Secondary | ICD-10-CM

## 2022-08-28 DIAGNOSIS — R311 Benign essential microscopic hematuria: Secondary | ICD-10-CM | POA: Diagnosis not present

## 2022-08-28 DIAGNOSIS — R350 Frequency of micturition: Secondary | ICD-10-CM

## 2022-08-28 LAB — POCT URINALYSIS DIPSTICK
Bilirubin, UA: NEGATIVE
Blood, UA: POSITIVE
Glucose, UA: NEGATIVE
Ketones, UA: NEGATIVE
Leukocytes, UA: NEGATIVE
Nitrite, UA: NEGATIVE
Protein, UA: POSITIVE — AB
Spec Grav, UA: 1.025 (ref 1.010–1.025)
Urobilinogen, UA: 0.2 E.U./dL
pH, UA: 6 (ref 5.0–8.0)

## 2022-08-28 MED ORDER — NITROFURANTOIN MONOHYD MACRO 100 MG PO CAPS: 100.0000 mg | ORAL_CAPSULE | Freq: Two times a day (BID) | ORAL | 0 refills | Status: AC

## 2022-08-28 MED ORDER — SULFAMETHOXAZOLE-TRIMETHOPRIM 800-160 MG PO TABS: 1.0000 | ORAL_TABLET | Freq: Two times a day (BID) | ORAL | 0 refills | Status: DC

## 2022-08-28 NOTE — Progress Notes (Signed)
Established Patient Office Visit  Subjective:   Patient ID: Michelle Mahoney, female    DOB: 1956-02-01  Age: 67 y.o. MRN: [id number]  CC:  Chief Complaint  Patient presents with   Urinary Frequency    HPI: Michelle Mahoney is a 67 y.o. female presenting on 08/28/2022 for Urinary Frequency   Urinary Frequency  Associated symptoms include frequency.    Last night started with increased urinary frequency, and urgency with lower abd pressure. No visual blood in urine, no abn urine odor. No flank pain. No fever or chills.             ROS: Negative unless specifically indicated above in HPI.   Relevant past medical history reviewed and updated as indicated.   Allergies and medications reviewed and updated.   Current Outpatient Medications:    ALPRAZolam (XANAX) 0.5 MG tablet, TAKE ONE TABLET AT BEDTIME AS NEEDED FOR ANXIETY, Disp: 30 tablet, Rfl: 5   AMBULATORY NON FORMULARY MEDICATION, Medication Name: Prevager once daily, Disp: , Rfl:    escitalopram (LEXAPRO) 10 MG tablet, TAKE 1 TABLET BY MOUTH EVERY DAY, Disp: 90 tablet, Rfl: 1   hydrochlorothiazide (HYDRODIURIL) 25 MG tablet, TAKE 1 TABLET BY MOUTH EVERY DAY, Disp: 90 tablet, Rfl: 3   hydrOXYzine (VISTARIL) 25 MG capsule, Take 2 capsules (50 mg total) by mouth every 8 (eight) hours as needed for anxiety., Disp: 60 capsule, Rfl: 5   nitrofurantoin, macrocrystal-monohydrate, (MACROBID) 100 MG capsule, Take 1 capsule (100 mg total) by mouth 2 (two) times daily for 5 days., Disp: 10 capsule, Rfl: 0   telmisartan (MICARDIS) 40 MG tablet, TAKE 1 TABLET BY MOUTH EVERY DAY, Disp: 90 tablet, Rfl: 1   traZODone (DESYREL) 50 MG tablet, TAKE 1 TABLET BY MOUTH EVERY DAY AT BEDTIME AS NEEDED FOR INSOMNIA, Disp: 90 tablet, Rfl: 1   valACYclovir (VALTREX) 1000 MG tablet, SMARTSIG:2 Tablet(s) By Mouth Every 12 Hours, Disp: , Rfl:   Allergies  Allergen Reactions   Simvastatin Other (See Comments)    Elevated liver enzymes     Objective:   BP 128/82   Pulse 81   Temp 98 F (36.7 C) (Temporal)   Ht 5\' 2"  (1.575 m)   Wt 143 lb 3.2 oz (65 kg)   SpO2 99%   BMI 26.19 kg/m    Physical Exam Constitutional:      General: She is not in acute distress.    Appearance: Normal appearance. She is normal weight. She is not ill-appearing, toxic-appearing or diaphoretic.  Cardiovascular:     Rate and Rhythm: Normal rate.  Pulmonary:     Effort: Pulmonary effort is normal.  Abdominal:     General: Abdomen is flat.     Tenderness: There is no abdominal tenderness. There is no right CVA tenderness or left CVA tenderness.  Neurological:     General: No focal deficit present.     Mental Status: She is alert and oriented to person, place, and time. Mental status is at baseline.     Motor: No weakness.  Psychiatric:        Mood and Affect: Mood normal.        Behavior: Behavior normal.        Thought Content: Thought content normal.        Judgment: Judgment normal.     Assessment & Plan:  Urinary frequency -     POCT urinalysis dipstick -     Urine Culture  Benign essential microscopic hematuria -  Urine Culture -     Urinalysis, Routine w reflex microscopic  Acute cystitis with hematuria Assessment & Plan: poct urine dip in office Urine culture ordered pending results antbx sent to pharmacy, pt to take as directed. Encouraged increased water intake throughout the day. Choosing to treat due to being symptomatic. If no improvement in the next 2 days pt advised to let me know.   Orders: -     Nitrofurantoin Monohyd Macro; Take 1 capsule (100 mg total) by mouth 2 (two) times daily for 5 days.  Dispense: 10 capsule; Refill: 0     Follow up plan: No follow-ups on file.  Eugenia Pancoast, FNP

## 2022-08-28 NOTE — Addendum Note (Signed)
Addended by: Ellamae Sia on: 08/28/2022 12:30 PM   Modules accepted: Orders

## 2022-08-28 NOTE — Assessment & Plan Note (Signed)
poct urine dip in office Urine culture ordered pending results antbx sent to pharmacy, pt to take as directed. Encouraged increased water intake throughout the day. Choosing to treat due to being symptomatic. If no improvement in the next 2 days pt advised to let me know.  

## 2022-08-28 NOTE — Patient Instructions (Signed)
It was a pleasure seeing you today.   You were found to have a urinary tract infection, you have been prescribed an antibiotic to your preferred pharmacy. Please start antibiotic today as directed.   We are sending your urine for a culture to make sure you do not have a resistant bacteria. We will call you if we need to change your medications.   Please make sure you are drinking plenty of fluids over the next few days.  If your symptoms do not improve over the next 5-7 days, or if they worsen, please let us know. Please also let us know if you have worsening back pain, fevers, chills, or body aches.   Regards,   Teylor Wolven  

## 2022-08-29 LAB — URINE CULTURE
MICRO NUMBER:: 14717943
Result:: NO GROWTH
SPECIMEN QUALITY:: ADEQUATE

## 2022-09-06 DIAGNOSIS — R69 Illness, unspecified: Secondary | ICD-10-CM | POA: Diagnosis not present

## 2022-09-07 DIAGNOSIS — R69 Illness, unspecified: Secondary | ICD-10-CM | POA: Diagnosis not present

## 2022-09-23 ENCOUNTER — Encounter: Payer: Self-pay | Admitting: Pharmacist

## 2022-09-23 DIAGNOSIS — G720 Drug-induced myopathy: Secondary | ICD-10-CM

## 2022-09-23 NOTE — Progress Notes (Signed)
[facility]) [facility] Quality Pharmacy Team Statin Quality Measure Assessment  09/23/2022  Michelle Mahoney 08-Oct-1955 [id number]  Per review of chart and payor information, patient has a diagnosis of diabetes but is not currently filling a statin prescription.  This places patient into the Statin Use In Patients with Diabetes (SUPD) measure for CMS.    Patient has documented trials of simvastatin  with reported myopathy, but no corresponding CPT codes that would exclude patient from SUPD measure.  She has an upcoming appointment 09/24/2022.  If deemed therapeutically appropriate, a statin exclusion code could be associated with the appointment (which would remove the patient from the measure) or she could be prescribed an alternative low dose statin with a longer dosing interval.  The 10-year ASCVD risk score (Arnett DK, et al., 2019) is: 7.8%   Values used to calculate the score:     Age: 67 years     Sex: Female     Is Non-Hispanic African American: No     Diabetic: No     Tobacco smoker: No     Systolic Blood Pressure: 128 mmHg     Is BP treated: Yes     HDL Cholesterol: 62.8 mg/dL     Total Cholesterol: 240 mg/dL 74/0/8144     Component Value Date/Time   CHOL 240 (H) 05/17/2022 0821   TRIG 94.0 05/17/2022 0821   HDL 62.80 05/17/2022 0821   CHOLHDL 4 05/17/2022 0821   VLDL 18.8 05/17/2022 0821   LDLCALC 158 (H) 05/17/2022 0821    Please consider ONE of the following recommendations:  Initiate high intensity statin Atorvastatin 40 mg once daily, #90, 3 refills   Rosuvastatin 20 mg once daily, #90, 3 refills    Initiate moderate intensity          statin with reduced frequency if prior          statin intolerance 1x weekly, #13, 3 refills   2x weekly, #26, 3 refills   3x weekly, #39, 3 refills    Code for past statin intolerance or  other exclusions (required annually)  Provider Requirements: Associate code during an office visit or telehealth encounter   Drug Induced Myopathy G72.0   Myopathy, unspecified G72.9   Myositis, unspecified M60.9   Rhabdomyolysis M62.82   Cirrhosis of liver K74.69   Prediabetes R73.03   PCOS E28.2   Plan:  Route note to patient's provider prior to the upcoming appointment.  Beecher Mcardle, PharmD, BCACP [facility] Clinical Pharmacist [phone]

## 2022-09-24 ENCOUNTER — Encounter: Payer: Self-pay | Admitting: Internal Medicine

## 2022-09-24 ENCOUNTER — Ambulatory Visit: Payer: Medicare HMO | Admitting: Internal Medicine

## 2022-09-24 ENCOUNTER — Telehealth: Payer: Self-pay

## 2022-09-24 VITALS — BP 112/74 | HR 60 | Temp 98.2°F | Ht 62.0 in | Wt 142.0 lb

## 2022-09-24 DIAGNOSIS — F5105 Insomnia due to other mental disorder: Secondary | ICD-10-CM | POA: Diagnosis not present

## 2022-09-24 DIAGNOSIS — M791 Myalgia, unspecified site: Secondary | ICD-10-CM | POA: Diagnosis not present

## 2022-09-24 DIAGNOSIS — F411 Generalized anxiety disorder: Secondary | ICD-10-CM | POA: Diagnosis not present

## 2022-09-24 DIAGNOSIS — Z0000 Encounter for general adult medical examination without abnormal findings: Secondary | ICD-10-CM | POA: Diagnosis not present

## 2022-09-24 DIAGNOSIS — Z1231 Encounter for screening mammogram for malignant neoplasm of breast: Secondary | ICD-10-CM

## 2022-09-24 DIAGNOSIS — E876 Hypokalemia: Secondary | ICD-10-CM

## 2022-09-24 DIAGNOSIS — F419 Anxiety disorder, unspecified: Secondary | ICD-10-CM | POA: Diagnosis not present

## 2022-09-24 DIAGNOSIS — T466X5A Adverse effect of antihyperlipidemic and antiarteriosclerotic drugs, initial encounter: Secondary | ICD-10-CM

## 2022-09-24 DIAGNOSIS — T502X5A Adverse effect of carbonic-anhydrase inhibitors, benzothiadiazides and other diuretics, initial encounter: Secondary | ICD-10-CM

## 2022-09-24 DIAGNOSIS — I10 Essential (primary) hypertension: Secondary | ICD-10-CM

## 2022-09-24 DIAGNOSIS — E7800 Pure hypercholesterolemia, unspecified: Secondary | ICD-10-CM

## 2022-09-24 NOTE — Assessment & Plan Note (Addendum)

## 2022-09-24 NOTE — Assessment & Plan Note (Signed)
Untreated due to liver enzyme elevation while taking simvastatin.  LFTs normalized with cessation of statin therapy Based on Dec 2023 panel  even with concurrent hypertension,  Her 10 yr risk using the [facility] calculator is 5 % .  Will not resume  therapy unless risk is > 10%   Lab Results  Component Value Date   CHOL 240 (H) 05/17/2022   HDL 62.80 05/17/2022   LDLCALC 158 (H) 05/17/2022   TRIG 94.0 05/17/2022   CHOLHDL 4 05/17/2022

## 2022-09-24 NOTE — Patient Instructions (Signed)
  You will be due for your tetanus-diptheria-pertussis vaccine   (TDaP)   next year, (due every 10 yrs)   You can  get it for free at your pharmacy

## 2022-09-24 NOTE — Assessment & Plan Note (Addendum)
She is Using  hydroxyzine and trazodone  for insomia; reserve the alprazolam for daytime prn use for stress/anger .  The risks of Chronic  alprazolam  use were reviewed  with patient today including increased risk of dementia and addiction,  and to continue  lexapro use daily

## 2022-09-24 NOTE — Assessment & Plan Note (Addendum)
Occurred with  simvastatin.  Will consider zetia if risk is > 10%

## 2022-09-24 NOTE — Assessment & Plan Note (Signed)
Well controlled on current regimen of telmisartan and hctz . Renal function stable, no changes today. \  Lab Results  Component Value Date   CREATININE 0.84 05/17/2022

## 2022-09-24 NOTE — Assessment & Plan Note (Signed)
Managed with trazodone and hydroxzine most nights. Occasional use of alprazolam reviewed

## 2022-09-24 NOTE — Telephone Encounter (Signed)
PA for hydroxyzine is needed

## 2022-09-24 NOTE — Progress Notes (Addendum)
The patient is here for the Welcome to  Medicare  preventive visit     has a past medical history of Allergy, Chicken pox, and Sinus bradycardia.    reports that she has never smoked. She has never used smokeless tobacco. She reports that she does not currently use alcohol. She reports that she does not use drugs.   The roster of all physicians providing medical care to patient : Patient Care Team: Sherlene Shams, MD as PCP - General (Internal Medicine) Sherlene Shams, MD (Internal Medicine) Lemar Livings Merrily Pew, MD (General Surgery)  Activities of daily living:  The patient is 100% independent in all ADLs: dressing, toileting, feeding as well as independent mobility Fall risk was assessed by direct patient evaluation of patient's balance, gait and ability to risk from a chair and from a kneeling position. Home safety : The patient has smoke detectors in the home. They wear seatbelts.  There are no firearms at home. There is no violence in the home.  Patient has seen their eye doctor in the last year.   Visual acuity was assessed today  and was 20/20 with correction lenses. Patient denies hearing difficulty with regard to whispered voices and some television programs and has  deferred audiologic testing in the last year.   There is no risks for hepatitis, STDs or HIV. There is no   history of blood transfusion. They have no travel history to infectious disease endemic areas of the world.  The patient has seen their dentist in the last six month.  They do not  have excessive sun exposure. Discussed the need for sun protection: hats, long sleeves and use of sunscreen if there is significant sun exposure.   Diet: the importance of a healthy diet is discussed. They do have a healthy diet.  The benefits of regular aerobic exercise were discussed. Patient walks 4 times per week ,  a minimum of 20 minutes.   Depression screen:      09/24/2022    2:04 PM 06/17/2022    3:06 PM 05/22/2022    3:39  PM 10/10/2021    9:01 AM 09/12/2021    4:30 PM  Depression screen PHQ 2/9  Decreased Interest 0 0 0 0 0  Down, Depressed, Hopeless 0 0 0 0 0  PHQ - 2 Score 0 0 0 0 0  Altered sleeping  0     Tired, decreased energy  0     Change in appetite  0     Feeling bad or failure about yourself   0     Trouble concentrating  0     Moving slowly or fidgety/restless  0     Suicidal thoughts  0     PHQ-9 Score  0     Difficult doing work/chores  Not difficult at all          Cognitive assessment: the patient manages all their financial and personal affairs and is actively engaged. They could relate day,date,year and events; recalled 2/3 objects at 3 minutes; performed clock-face test normally.  The following portions of the patient's history were reviewed and updated as appropriate: allergies, current medications, past family history, past medical history,  past surgical history, past social history  and problem list.  During the course of the visit the patient was educated and counseled about appropriate screening and preventive services including : fall prevention , diabetes screening, nutrition counseling, colorectal cancer screening, and recommended immunizations   Immunization History  Administered Date(s) Administered   Tdap 10/25/2013  HMLISTPATIENTFRIENDLY@ Health Maintenance Due  Topic Date Due   COVID-19 Vaccine (1) Never done    Last skin cancer screening : never      Vital Signs: BP 112/74   Pulse 60   Temp 98.2 F (36.8 C) (Oral)   Ht  (1.575 m)   Wt 142 lb (64.4 kg)   SpO2 97%   BMI 25.97 kg/m    Exam: General appearance: alert, cooperative and appears stated age Head: Normocephalic, without obvious abnormality, atraumatic Eyes: conjunctivae/corneas clear. PERRL, EOM's intact. Fundi benign. Ears: normal TM's and external ear canals both ears Nose: Nares normal. Septum midline. Mucosa normal. No drainage or sinus tenderness. Throat: lips, mucosa, and tongue  normal; teeth and gums normal Neck: no adenopathy, no carotid bruit, no JVD, supple, symmetrical, trachea midline and thyroid not enlarged, symmetric, no tenderness/mass/nodules Lungs: clear to auscultation bilaterally Breasts: normal appearance, no masses or tenderness Heart: regular rate and rhythm, S1, S2 normal, no murmur, click, rub or gallop Abdomen: soft, non-tender; bowel sounds normal; no masses,  no organomegaly Extremities: extremities normal, atraumatic, no cyanosis or edema Pulses: 2+ and symmetric Skin: Skin color, texture, turgor normal. No rashes or lesions Neurologic: Alert and oriented X 3, normal strength and tone. Normal symmetric reflexes. Normal coordination and gait.     End of Life Discussion and Planning   During the course of the visit , End of Life objectives were discussed at length.  Patient does not have a living will in place or a healthcare power of attorney.  Patient  was given printed information about advance directives and encouraged to return after discussing with their family.  Review of Opioid Prescriptions    Patient does not have a current opioid prescription Patient's risk factors for opioid use disorder was reviewed and includes nothing  Treatment of pain using non-opioid alternatives was reviewed  and encouraged   Patient risk for potential substance abuse was assessed and addressed with counselling.    Assessment and Plan  Anxiety state Assessment & Plan:  She is Using  hydroxyzine and trazodone  for insomia; reserve the alprazolam for daytime prn use for stress/anger .  The risks of Chronic  alprazolam  use were reviewed  with patient today including increased risk of dementia and addiction,  and to continue  lexapro use daily    Welcome to Medicare preventive visit Assessment & Plan: age appropriate education and counseling updated, referrals for preventative services and immunizations addressed, dietary and smoking counseling addressed,  most recent labs reviewed.  I have personally reviewed and have noted:   1) the patient's medical and social history 2) The pt's use of alcohol, tobacco, and illicit drugs 3) The patient's current medications and supplements 4) Functional ability including ADL's, fall risk, home safety risk, hearing and visual impairment 5) Diet and physical activities 6) Evidence for depression or mood disorder 7) The patient's height, weight, and BMI have been recorded in the chart 8) I have ordered and reviewed a 12 lead EKG and find that there are no acute changes and patient is in sinus rhythm.     I have made referrals, and provided counseling and education based on review of the above   Orders: -     EKG 12-Lead  Breast cancer screening by mammogram -     3D Screening Mammogram, Left and Right; Future  Pure hypercholesterolemia Assessment & Plan: Untreated due to liver enzyme elevation while taking  simvastatin.  LFTs normalized with cessation of statin therapy Based on Dec 2023 panel  even with concurrent hypertension,  Her 10 yr risk using the [facility] calculator is 5 % .  Will not resume  therapy unless risk is > 10%   Lab Results  Component Value Date   CHOL 240 (H) 05/17/2022   HDL 62.80 05/17/2022   LDLCALC 158 (H) 05/17/2022   TRIG 94.0 05/17/2022   CHOLHDL 4 05/17/2022    Orders: -     Comprehensive metabolic panel -     Lipid Panel w/reflex Direct LDL -     TSH  Myalgia due to statin Assessment & Plan: Occurred with  simvastatin.  Will consider zetia if risk is > 10%    Insomnia secondary to anxiety Assessment & Plan: Managed with trazodone and hydroxzine most nights. Occasional use of alprazolam reviewed    Essential hypertension Assessment & Plan: Well controlled on current regimen of telmisartan and hctz . Renal function stable, no changes today. \  Lab Results  Component Value Date   CREATININE 0.84 05/17/2022

## 2022-09-25 ENCOUNTER — Encounter: Payer: Self-pay | Admitting: Internal Medicine

## 2022-09-25 ENCOUNTER — Other Ambulatory Visit: Payer: Self-pay | Admitting: Internal Medicine

## 2022-09-25 DIAGNOSIS — E876 Hypokalemia: Secondary | ICD-10-CM | POA: Insufficient documentation

## 2022-09-25 LAB — COMPREHENSIVE METABOLIC PANEL
ALT: 12 U/L (ref 0–35)
AST: 21 U/L (ref 0–37)
Albumin: 4.6 g/dL (ref 3.5–5.2)
Alkaline Phosphatase: 73 U/L (ref 39–117)
BUN: 12 mg/dL (ref 6–23)
CO2: 28 mEq/L (ref 19–32)
Calcium: 9.8 mg/dL (ref 8.4–10.5)
Chloride: 99 mEq/L (ref 96–112)
Creatinine, Ser: 0.84 mg/dL (ref 0.40–1.20)
GFR: 72.97 mL/min (ref >60.00)
Glucose, Bld: 93 mg/dL (ref 70–99)
Potassium: 3.4 mEq/L — ABNORMAL LOW (ref 3.5–5.1)
Sodium: 138 mEq/L (ref 135–145)
Total Bilirubin: 0.7 mg/dL (ref 0.2–1.2)
Total Protein: 6.8 g/dL (ref 6.0–8.3)

## 2022-09-25 LAB — LIPID PANEL W/REFLEX DIRECT LDL
Cholesterol: 280 mg/dL — ABNORMAL HIGH (ref <200)
HDL: 63 mg/dL (ref >=50)
LDL Cholesterol (Calc): 176 mg/dL (calc) — ABNORMAL HIGH
Non-HDL Cholesterol (Calc): 217 mg/dL (calc) — ABNORMAL HIGH (ref <130)
Total CHOL/HDL Ratio: 4.4 (calc) (ref <5.0)
Triglycerides: 239 mg/dL — ABNORMAL HIGH (ref <150)

## 2022-09-25 LAB — TSH: TSH: 2.15 u[IU]/mL (ref 0.35–5.50)

## 2022-09-25 MED ORDER — POTASSIUM CHLORIDE CRYS ER 20 MEQ PO TBCR
20.0000 meq | EXTENDED_RELEASE_TABLET | Freq: Every day | ORAL | 0 refills | Status: DC
Start: 2022-09-25 — End: 2022-10-24

## 2022-09-25 NOTE — Assessment & Plan Note (Signed)
Stopping hctz.  Replace potassium for one week.. recheck in one month

## 2022-09-25 NOTE — Addendum Note (Signed)
Addended by: Sherlene Shams on: 09/25/2022 05:25 PM   Modules accepted: Orders

## 2022-09-26 ENCOUNTER — Encounter: Payer: Self-pay | Admitting: Internal Medicine

## 2022-09-26 ENCOUNTER — Other Ambulatory Visit: Payer: Self-pay | Admitting: Internal Medicine

## 2022-09-26 ENCOUNTER — Telehealth: Payer: Self-pay | Admitting: Internal Medicine

## 2022-09-26 DIAGNOSIS — E781 Pure hyperglyceridemia: Secondary | ICD-10-CM | POA: Insufficient documentation

## 2022-09-26 MED ORDER — FENOFIBRATE 48 MG PO TABS
48.0000 mg | ORAL_TABLET | Freq: Every day | ORAL | 0 refills | Status: DC
Start: 2022-09-26 — End: 2022-12-23

## 2022-09-26 NOTE — Telephone Encounter (Signed)
Called [facility] spoke to Ione in Customer service about the Reflex Direct LDL test. Wynona Canes stated she didn't have an answer for me what Triggers it and why that part wasn't resulted. Wynona Canes stated she was going to have a Control and instrumentation engineer contact me back with an answer.

## 2022-09-26 NOTE — Telephone Encounter (Signed)
-----   Message from Warden Fillers, [place] sent at 09/25/2022  5:06 PM EDT ----- Will route to Noreene Larsson to check on tomorrow

## 2022-09-26 NOTE — Telephone Encounter (Signed)
Rosey Bath Love/ Med Tech from [employer] called back about the Reflex Direct LDL. Rosey Bath stated that the Reflex was not done because the Patient Triglycerides was only 293. In order to run the Reflex it has to be 400 or Greater. I spoke to Dr. Darrick Huntsman about this and she agreed not to do the Reflex.

## 2022-09-27 ENCOUNTER — Encounter: Payer: Self-pay | Admitting: Internal Medicine

## 2022-09-30 DIAGNOSIS — M50323 Other cervical disc degeneration at C6-C7 level: Secondary | ICD-10-CM | POA: Diagnosis not present

## 2022-09-30 DIAGNOSIS — M542 Cervicalgia: Secondary | ICD-10-CM | POA: Diagnosis not present

## 2022-09-30 DIAGNOSIS — M50321 Other cervical disc degeneration at C4-C5 level: Secondary | ICD-10-CM | POA: Diagnosis not present

## 2022-09-30 DIAGNOSIS — M50322 Other cervical disc degeneration at C5-C6 level: Secondary | ICD-10-CM | POA: Diagnosis not present

## 2022-09-30 DIAGNOSIS — M7918 Myalgia, other site: Secondary | ICD-10-CM | POA: Diagnosis not present

## 2022-09-30 DIAGNOSIS — M5137 Other intervertebral disc degeneration, lumbosacral region: Secondary | ICD-10-CM | POA: Diagnosis not present

## 2022-09-30 DIAGNOSIS — M9903 Segmental and somatic dysfunction of lumbar region: Secondary | ICD-10-CM | POA: Diagnosis not present

## 2022-09-30 DIAGNOSIS — M5451 Vertebrogenic low back pain: Secondary | ICD-10-CM | POA: Diagnosis not present

## 2022-09-30 DIAGNOSIS — M9904 Segmental and somatic dysfunction of sacral region: Secondary | ICD-10-CM | POA: Diagnosis not present

## 2022-09-30 DIAGNOSIS — M9901 Segmental and somatic dysfunction of cervical region: Secondary | ICD-10-CM | POA: Diagnosis not present

## 2022-10-10 ENCOUNTER — Telehealth: Payer: Self-pay

## 2022-10-10 ENCOUNTER — Other Ambulatory Visit (HOSPITAL_COMMUNITY): Payer: Self-pay

## 2022-10-10 NOTE — Telephone Encounter (Signed)
PA approved. Effective: 10/10/22 - 06/10/2023

## 2022-10-10 NOTE — Telephone Encounter (Signed)
noted 

## 2022-10-10 NOTE — Telephone Encounter (Signed)
Prior authorization for hydrOXYzine Pamoate 25MG  capsules submitted and APPROVED. Test billing returns $1.17 copay for 10 day supply.  Key [id number] Effective: 10/10/22 - 06/10/2023

## 2022-10-17 DIAGNOSIS — M50321 Other cervical disc degeneration at C4-C5 level: Secondary | ICD-10-CM | POA: Diagnosis not present

## 2022-10-17 DIAGNOSIS — M5451 Vertebrogenic low back pain: Secondary | ICD-10-CM | POA: Diagnosis not present

## 2022-10-17 DIAGNOSIS — M5137 Other intervertebral disc degeneration, lumbosacral region: Secondary | ICD-10-CM | POA: Diagnosis not present

## 2022-10-17 DIAGNOSIS — M50322 Other cervical disc degeneration at C5-C6 level: Secondary | ICD-10-CM | POA: Diagnosis not present

## 2022-10-17 DIAGNOSIS — M7918 Myalgia, other site: Secondary | ICD-10-CM | POA: Diagnosis not present

## 2022-10-17 DIAGNOSIS — M50323 Other cervical disc degeneration at C6-C7 level: Secondary | ICD-10-CM | POA: Diagnosis not present

## 2022-10-17 DIAGNOSIS — M542 Cervicalgia: Secondary | ICD-10-CM | POA: Diagnosis not present

## 2022-10-17 DIAGNOSIS — M9904 Segmental and somatic dysfunction of sacral region: Secondary | ICD-10-CM | POA: Diagnosis not present

## 2022-10-17 DIAGNOSIS — M9901 Segmental and somatic dysfunction of cervical region: Secondary | ICD-10-CM | POA: Diagnosis not present

## 2022-10-17 DIAGNOSIS — M9903 Segmental and somatic dysfunction of lumbar region: Secondary | ICD-10-CM | POA: Diagnosis not present

## 2022-10-23 ENCOUNTER — Other Ambulatory Visit: Payer: Self-pay | Admitting: Internal Medicine

## 2022-10-23 DIAGNOSIS — T502X5A Adverse effect of carbonic-anhydrase inhibitors, benzothiadiazides and other diuretics, initial encounter: Secondary | ICD-10-CM

## 2022-10-24 NOTE — Telephone Encounter (Signed)
Refilled: 09/25/2022 for a 7 day supply Last OV: 09/24/2022 Next OV: 03/26/2023  Pt never returned for labs after taking the potassium for one week.

## 2022-11-06 ENCOUNTER — Ambulatory Visit
Admission: RE | Admit: 2022-11-06 | Discharge: 2022-11-06 | Disposition: A | Discharge status: Home / Self Care | Payer: Medicare HMO | Source: Ambulatory Visit | Attending: Internal Medicine | Admitting: Internal Medicine

## 2022-11-06 DIAGNOSIS — Z1231 Encounter for screening mammogram for malignant neoplasm of breast: Secondary | ICD-10-CM | POA: Insufficient documentation

## 2022-11-07 ENCOUNTER — Encounter: Payer: Self-pay | Admitting: Internal Medicine

## 2022-11-28 ENCOUNTER — Encounter: Payer: Self-pay | Admitting: Internal Medicine

## 2022-12-09 ENCOUNTER — Encounter: Payer: Self-pay | Admitting: Pharmacist

## 2022-12-09 NOTE — Progress Notes (Signed)
[facility]) [facility] Team Statin Quality Measure Assessment  12/09/2022  Michelle Mahoney December 18, 1955 [id number]  Per review of chart and payor information, patient has a diagnosis of diabetes but is not currently filling a statin prescription.  This places patient into the Statin Use In Patients with Diabetes (SUPD) measure for CMS.    Patient has documented trials of statins with reported myalgia, but no corresponding CPT codes that would exclude patient from SUPD measure. M70.10 was associated with a previous note but that code only works for the [facility] measure and will not remove Patient's from the SUPD measure.  If deemed therapeutically appropriate, a SUPD statin exclusion code could be associated with the upcoming appointment on 12/10/22/  Associating a SUPD exclusion code to the visit would remove the patient from the measure.    The 10-year ASCVD risk score (Arnett DK, et al., 2019) is: 6.5%   Values used to calculate the score:     Age: 67 years     Sex: Female     Is Non-Hispanic African American: No     Diabetic: No     Tobacco smoker: No     Systolic Blood Pressure: 112 mmHg     Is BP treated: Yes     HDL Cholesterol: 63 mg/dL     Total Cholesterol: 280 mg/dL 7/82/9562     Component Value Date/Time   CHOL 280 (H) 09/24/2022 1456   TRIG 239 (H) 09/24/2022 1456   HDL 63 09/24/2022 1456   CHOLHDL 4.4 09/24/2022 1456   VLDL 18.8 05/17/2022 0821   LDLCALC 176 (H) 09/24/2022 1456    Please consider ONE of the following recommendations:  Initiate high intensity statin Atorvastatin 40 mg once daily, #90, 3 refills   Rosuvastatin 20 mg once daily, #90, 3 refills    Initiate moderate intensity          statin with reduced frequency if prior          statin intolerance 1x weekly, #13, 3 refills   2x weekly, #26, 3 refills   3x weekly, #39, 3 refills    Code for past statin intolerance or  other exclusions (required annually)  Provider  Requirements: Associate code during an office visit or telehealth encounter  Drug Induced Myopathy G72.0   Myopathy, unspecified G72.9   Myositis, unspecified M60.9   Rhabdomyolysis M62.82   Cirrhosis of liver K74.69   Prediabetes R73.03   PCOS E28.2   Plan: Route note to Patient's provider prior to the upcoming appointment.    Beecher Mcardle, PharmD, BCACP [facility] Clinical Pharmacist [phone]

## 2022-12-10 ENCOUNTER — Telehealth (INDEPENDENT_AMBULATORY_CARE_PROVIDER_SITE_OTHER): Payer: Medicare HMO | Admitting: Internal Medicine

## 2022-12-10 ENCOUNTER — Encounter: Payer: Self-pay | Admitting: Internal Medicine

## 2022-12-10 VITALS — Ht 62.0 in | Wt 142.0 lb

## 2022-12-10 DIAGNOSIS — I10 Essential (primary) hypertension: Secondary | ICD-10-CM

## 2022-12-10 DIAGNOSIS — T502X5A Adverse effect of carbonic-anhydrase inhibitors, benzothiadiazides and other diuretics, initial encounter: Secondary | ICD-10-CM

## 2022-12-10 DIAGNOSIS — F419 Anxiety disorder, unspecified: Secondary | ICD-10-CM | POA: Diagnosis not present

## 2022-12-10 DIAGNOSIS — F5105 Insomnia due to other mental disorder: Secondary | ICD-10-CM

## 2022-12-10 DIAGNOSIS — E7800 Pure hypercholesterolemia, unspecified: Secondary | ICD-10-CM | POA: Diagnosis not present

## 2022-12-10 DIAGNOSIS — E781 Pure hyperglyceridemia: Secondary | ICD-10-CM

## 2022-12-10 DIAGNOSIS — F411 Generalized anxiety disorder: Secondary | ICD-10-CM | POA: Diagnosis not present

## 2022-12-10 DIAGNOSIS — E876 Hypokalemia: Secondary | ICD-10-CM

## 2022-12-10 NOTE — Patient Instructions (Addendum)
Resume lexapro (escitalopram)  for your anxiety . Take it in the am with food  Take the fenofibrate for your cholesterol in the morning with your lexapro  Take the telmisartan in the evening for blood pressure  Take trazodone at bedtime with melatonin for insomnia.  Ok to add hydroxyzine If additional medication is needed    Save the alprazolam for RARE occasions of anger or panic try not to take it daily; it's addictive) and don't take it if you have been drinking alcohol   Please schedule a lab visit when you return.  Fast for 4 hours prior to blood draw

## 2022-12-10 NOTE — Progress Notes (Unsigned)
Telephone  Note   This format is felt to be most appropriate for this patient at this time.  All issues noted in this document were discussed and addressed.  No physical exam was performed (except for noted visual exam findings with Video Visits).   I connected with Frenchie on 12/10/22 at  4:30 PM EDT by a video enabled telemedicine application  and verified that I am speaking with the correct person using two identifiers. Location patient: home Location provider: work or home office Persons participating in the virtual visit: patient, provider  I discussed the limitations, risks, security and privacy concerns of performing an evaluation and management service by telephone and the availability of in person appointments. I also discussed with the patient that there may be a patient responsible charge related to this service. The patient expressed understanding and agreed to proceed.  Interactive audio and video telecommunications were attempted between this provider and patient, however failed, due to patient having technical difficulties OR patient did not have access to video capability.  We continued and completed visit with audio only.   Reason for visit: follow up on medication changes done at last visit ann 2( depression/anxiety  HPI:   1) HTN:  hydrochlorothiazide stopped due to low K . Taking telmisartan at night   2) HLD: LDL 176 and trigs 239: fenofibrate taking in the morning   3) mood changes :  stopped lexapro for unclear reasons .  GAD  Has hydroxyzine and alprazolam or use    4) insomnia : trazodone and melatonin    ROS: See pertinent positives and negatives per HPI.  Past Medical History:  Diagnosis Date   Allergy    pollen    Chicken pox    Sinus bradycardia     Past Surgical History:  Procedure Laterality Date   ABDOMINAL HYSTERECTOMY  2001   LESION EXCISION  2008   anal area    Family History  Problem Relation Age of Onset   Cancer Mother 20        melanoma   Stroke Mother 63       secondary to septic emboli spine infection   Heart disease Father 79       Heart failure   Mental illness Father 52       dementia   Breast cancer Neg Hx    Colon cancer Neg Hx    Rectal cancer Neg Hx    Stomach cancer Neg Hx     SOCIAL HX:  reports that she has never smoked. She has never used smokeless tobacco. She reports that she does not currently use alcohol. She reports that she does not use drugs.    Current Outpatient Medications:    ALPRAZolam (XANAX) 0.5 MG tablet, TAKE ONE TABLET AT BEDTIME AS NEEDED FOR ANXIETY, Disp: 30 tablet, Rfl: 5   fenofibrate (TRICOR) 48 MG tablet, Take 1 tablet (48 mg total) by mouth daily., Disp: 90 tablet, Rfl: 0   hydrOXYzine (VISTARIL) 25 MG capsule, Take 2 capsules (50 mg total) by mouth every 8 (eight) hours as needed for anxiety. (Patient taking differently: Take 25 mg by mouth at bedtime.), Disp: 60 capsule, Rfl: 5   telmisartan (MICARDIS) 40 MG tablet, TAKE 1 TABLET BY MOUTH EVERY DAY, Disp: 90 tablet, Rfl: 1   traZODone (DESYREL) 50 MG tablet, TAKE 1 TABLET BY MOUTH EVERY DAY AT BEDTIME AS NEEDED FOR INSOMNIA, Disp: 90 tablet, Rfl: 1   valACYclovir (VALTREX) 1000 MG tablet, SMARTSIG:2 Tablet(s) By  Mouth Every 12 Hours, Disp: , Rfl:    escitalopram (LEXAPRO) 10 MG tablet, TAKE 1 TABLET BY MOUTH EVERY DAY (Patient not taking: Reported on 12/10/2022), Disp: 90 tablet, Rfl: 1   KLOR-CON M20 20 MEQ tablet, TAKE 1 TABLET BY MOUTH EVERY DAY (Patient not taking: Reported on 12/10/2022), Disp: 7 tablet, Rfl: 0  EXAM:  VITALS per patient if applicable:  GENERAL: alert, oriented, appears well and in no acute distress  HEENT: atraumatic, conjunttiva clear, no obvious abnormalities on inspection of external nose and ears  NECK: normal movements of the head and neck  LUNGS: on inspection no signs of respiratory distress, breathing rate appears normal, no obvious gross SOB, gasping or wheezing  CV: no obvious  cyanosis  MS: moves all visible extremities without noticeable abnormality  PSYCH/NEURO: pleasant and cooperative, no obvious depression or anxiety, speech and thought processing grossly intact  ASSESSMENT AND PLAN: There are no diagnoses linked to this encounter.    I discussed the assessment and treatment plan with the patient. The patient was provided an opportunity to ask questions and all were answered. The patient agreed with the plan and demonstrated an understanding of the instructions.   The patient was advised to call back or seek an in-person evaluation if the symptoms worsen or if the condition fails to improve as anticipated.   I spent 30 minutes dedicated to the care of this patient on the date of this encounter to include pre-visit review of his medical history,  Face-to-face time with the patient , and post visit ordering of testing and therapeutics.    Sherlene Shams, MD

## 2022-12-12 NOTE — Assessment & Plan Note (Signed)
Well controlled on current regimen of telmisartan and hctz  but recurent hypokalemia was noted at last visit.  Stopping hctz and potassium replaced  / Lab Results  Component Value Date   NA 138 09/24/2022   K 3.4 (L) 09/24/2022   CL 99 09/24/2022   CO2 28 09/24/2022     Lab Results  Component Value Date   CREATININE 0.84 09/24/2022

## 2022-12-12 NOTE — Assessment & Plan Note (Signed)
Managed with trazodone and hydroxzine most nights. Occasional use of alprazolam reviewed  

## 2022-12-12 NOTE — Assessment & Plan Note (Signed)
Untreated due to liver enzyme elevation while taking simvastatin.  Now managed with fenofibrate only.   Lab Results  Component Value Date   CHOL 280 (H) 09/24/2022   HDL 63 09/24/2022   LDLCALC 176 (H) 09/24/2022   TRIG 239 (H) 09/24/2022   CHOLHDL 4.4 09/24/2022

## 2022-12-12 NOTE — Assessment & Plan Note (Signed)
She is Using  hydroxyzine and trazodone  for insomia; reserve the alprazolam for daytime prn use for stress/anger .  The risks of Chronic  alprazolam  use were reviewed  with patient today including increased risk of dementia and addiction,  and to continue  lexapro use daily  

## 2022-12-12 NOTE — Assessment & Plan Note (Signed)
Managed with fenofibrate  Lab Results  Component Value Date   CHOL 280 (H) 09/24/2022   HDL 63 09/24/2022   LDLCALC 176 (H) 09/24/2022   TRIG 239 (H) 09/24/2022   CHOLHDL 4.4 09/24/2022

## 2022-12-20 ENCOUNTER — Other Ambulatory Visit: Payer: Self-pay | Admitting: Internal Medicine

## 2022-12-23 ENCOUNTER — Other Ambulatory Visit: Payer: Self-pay | Admitting: Internal Medicine

## 2022-12-25 ENCOUNTER — Other Ambulatory Visit: Payer: Self-pay | Admitting: Internal Medicine

## 2022-12-27 ENCOUNTER — Other Ambulatory Visit: Payer: Self-pay | Admitting: Internal Medicine

## 2022-12-31 DIAGNOSIS — K130 Diseases of lips: Secondary | ICD-10-CM | POA: Diagnosis not present

## 2022-12-31 DIAGNOSIS — L820 Inflamed seborrheic keratosis: Secondary | ICD-10-CM | POA: Diagnosis not present

## 2022-12-31 DIAGNOSIS — L2089 Other atopic dermatitis: Secondary | ICD-10-CM | POA: Diagnosis not present

## 2022-12-31 DIAGNOSIS — L538 Other specified erythematous conditions: Secondary | ICD-10-CM | POA: Diagnosis not present

## 2023-01-07 ENCOUNTER — Other Ambulatory Visit: Payer: Self-pay | Admitting: Internal Medicine

## 2023-01-08 ENCOUNTER — Other Ambulatory Visit (INDEPENDENT_AMBULATORY_CARE_PROVIDER_SITE_OTHER): Payer: Medicare HMO

## 2023-01-08 DIAGNOSIS — E7800 Pure hypercholesterolemia, unspecified: Secondary | ICD-10-CM

## 2023-01-08 DIAGNOSIS — E876 Hypokalemia: Secondary | ICD-10-CM

## 2023-01-08 DIAGNOSIS — T502X5A Adverse effect of carbonic-anhydrase inhibitors, benzothiadiazides and other diuretics, initial encounter: Secondary | ICD-10-CM

## 2023-01-08 DIAGNOSIS — E781 Pure hyperglyceridemia: Secondary | ICD-10-CM

## 2023-01-08 LAB — LDL CHOLESTEROL, DIRECT: Direct LDL: 148 mg/dL

## 2023-01-08 LAB — LIPID PANEL
Cholesterol: 233 mg/dL — ABNORMAL HIGH (ref 0–200)
HDL: 62.6 mg/dL (ref >39.00)
LDL Cholesterol: 157 mg/dL — ABNORMAL HIGH (ref 0–99)
NonHDL: 170.14
Total CHOL/HDL Ratio: 4
Triglycerides: 67 mg/dL (ref 0.0–149.0)
VLDL: 13.4 mg/dL (ref 0.0–40.0)

## 2023-01-08 LAB — HEPATIC FUNCTION PANEL
ALT: 13 U/L (ref 0–35)
AST: 20 U/L (ref 0–37)
Albumin: 4.4 g/dL (ref 3.5–5.2)
Alkaline Phosphatase: 67 U/L (ref 39–117)
Bilirubin, Direct: 0.1 mg/dL (ref 0.0–0.3)
Total Bilirubin: 0.5 mg/dL (ref 0.2–1.2)
Total Protein: 6.7 g/dL (ref 6.0–8.3)

## 2023-01-08 LAB — BASIC METABOLIC PANEL
BUN: 21 mg/dL (ref 6–23)
CO2: 22 mEq/L (ref 19–32)
Calcium: 9.7 mg/dL (ref 8.4–10.5)
Chloride: 109 mEq/L (ref 96–112)
Creatinine, Ser: 0.94 mg/dL (ref 0.40–1.20)
GFR: 63.63 mL/min (ref >60.00)
Glucose, Bld: 103 mg/dL — ABNORMAL HIGH (ref 70–99)
Potassium: 3.7 mEq/L (ref 3.5–5.1)
Sodium: 142 mEq/L (ref 135–145)

## 2023-01-11 ENCOUNTER — Other Ambulatory Visit: Payer: Self-pay | Admitting: Internal Medicine

## 2023-01-15 DIAGNOSIS — M50321 Other cervical disc degeneration at C4-C5 level: Secondary | ICD-10-CM | POA: Diagnosis not present

## 2023-01-15 DIAGNOSIS — M7918 Myalgia, other site: Secondary | ICD-10-CM | POA: Diagnosis not present

## 2023-01-15 DIAGNOSIS — M5137 Other intervertebral disc degeneration, lumbosacral region: Secondary | ICD-10-CM | POA: Diagnosis not present

## 2023-01-15 DIAGNOSIS — M9903 Segmental and somatic dysfunction of lumbar region: Secondary | ICD-10-CM | POA: Diagnosis not present

## 2023-01-15 DIAGNOSIS — M50323 Other cervical disc degeneration at C6-C7 level: Secondary | ICD-10-CM | POA: Diagnosis not present

## 2023-01-15 DIAGNOSIS — M542 Cervicalgia: Secondary | ICD-10-CM | POA: Diagnosis not present

## 2023-01-15 DIAGNOSIS — M9904 Segmental and somatic dysfunction of sacral region: Secondary | ICD-10-CM | POA: Diagnosis not present

## 2023-01-15 DIAGNOSIS — M5451 Vertebrogenic low back pain: Secondary | ICD-10-CM | POA: Diagnosis not present

## 2023-01-15 DIAGNOSIS — M50322 Other cervical disc degeneration at C5-C6 level: Secondary | ICD-10-CM | POA: Diagnosis not present

## 2023-01-15 DIAGNOSIS — M9901 Segmental and somatic dysfunction of cervical region: Secondary | ICD-10-CM | POA: Diagnosis not present

## 2023-02-19 ENCOUNTER — Other Ambulatory Visit: Payer: Self-pay | Admitting: Family

## 2023-03-26 ENCOUNTER — Encounter: Payer: Self-pay | Admitting: Internal Medicine

## 2023-03-26 ENCOUNTER — Ambulatory Visit: Payer: Medicare HMO | Admitting: Internal Medicine

## 2023-03-26 VITALS — BP 120/68 | HR 60 | Ht 62.0 in | Wt 142.8 lb

## 2023-03-26 DIAGNOSIS — F411 Generalized anxiety disorder: Secondary | ICD-10-CM | POA: Diagnosis not present

## 2023-03-26 DIAGNOSIS — G2581 Restless legs syndrome: Secondary | ICD-10-CM

## 2023-03-26 DIAGNOSIS — I10 Essential (primary) hypertension: Secondary | ICD-10-CM | POA: Diagnosis not present

## 2023-03-26 DIAGNOSIS — Z780 Asymptomatic menopausal state: Secondary | ICD-10-CM

## 2023-03-26 DIAGNOSIS — E781 Pure hyperglyceridemia: Secondary | ICD-10-CM | POA: Diagnosis not present

## 2023-03-26 NOTE — Assessment & Plan Note (Signed)
Well controlled on current regimen of telmisartan and hctz  but recurent hypokalemia was noted at last visit.  Stopping hctz and potassium replaced , repeat labs due

## 2023-03-26 NOTE — Assessment & Plan Note (Signed)
Managed with tizanidine prn .  Has not been problematic lately

## 2023-03-26 NOTE — Assessment & Plan Note (Signed)
She is Using  hydroxyzine and trazodone  for insomia; reserve the alprazolam for daytime prn use for stress/anger .  The risks of Chronic  alprazolam  use were reviewed  with patient today including increased risk of dementia and addiction,  and to continue  lexapro use daily

## 2023-03-26 NOTE — Assessment & Plan Note (Signed)
Managed with fenofibrate  Lab Results  Component Value Date   CHOL 233 (H) 01/08/2023   HDL 62.60 01/08/2023   LDLCALC 157 (H) 01/08/2023   LDLDIRECT 148.0 01/08/2023   TRIG 67.0 01/08/2023   CHOLHDL 4 01/08/2023

## 2023-03-26 NOTE — Progress Notes (Signed)
Subjective:  Patient ID: Michelle Mahoney, female    DOB: June 12, 1955  Age: 67 y.o. MRN: [id number]  CC: The primary encounter diagnosis was Postmenopausal estrogen deficiency. Diagnoses of Hypertriglyceridemia, Essential hypertension, Anxiety state, and Restless legs syndrome were also pertinent to this visit.   HPI Michelle Mahoney presents for  Chief Complaint  Patient presents with   Medical Management of Chronic Issues    hypertension    1) HTN: Hypertension: patient checks blood pressure rarely  at home.  Readings have been for the most part <130/80 at rest . Patient is following a reduced salt diet most days and is taking telmisartan 40 mg daily  as prescribed   2) occasional vertigo occurring with eyes closed when working out on her back . Resolves if she tilts her head forward.  Does not occur when running,  weight lifting etc  3) ANXIETY/INSOMNIA:  USING ALPRAZOLAM TO avoid losing temper with granchildren..  using hydroxyzine  as a sleep aid.     Outpatient Medications Prior to Visit  Medication Sig Dispense Refill   ALPRAZolam (XANAX) 0.5 MG tablet TAKE ONE TABLET AT BEDTIME AS NEEDED FOR ANXIETY 30 tablet 5   escitalopram (LEXAPRO) 10 MG tablet TAKE 1 TABLET BY MOUTH EVERY DAY 90 tablet 1   fenofibrate (TRICOR) 48 MG tablet TAKE 1 TABLET BY MOUTH EVERY DAY 90 tablet 3   hydrOXYzine (VISTARIL) 25 MG capsule Take 2 capsules (50 mg total) by mouth every 8 (eight) hours as needed for anxiety. (Patient taking differently: Take 25 mg by mouth at bedtime.) 60 capsule 5   telmisartan (MICARDIS) 40 MG tablet TAKE 1 TABLET BY MOUTH EVERY DAY 90 tablet 1   traZODone (DESYREL) 50 MG tablet TAKE 1 TABLET BY MOUTH AT BEDTIME AS NEEDED FOR INSOMNIA 90 tablet 1   valACYclovir (VALTREX) 1000 MG tablet SMARTSIG:2 Tablet(s) By Mouth Every 12 Hours     No facility-administered medications prior to visit.    Review of Systems;  Patient denies headache, fevers, malaise, unintentional weight  loss, skin rash, eye pain, sinus congestion and sinus pain, sore throat, dysphagia,  hemoptysis , cough, dyspnea, wheezing, chest pain, palpitations, orthopnea, edema, abdominal pain, nausea, melena, diarrhea, constipation, flank pain, dysuria, hematuria, urinary  Frequency, nocturia, numbness, tingling, seizures,  Focal weakness, Loss of consciousness,  Tremor, insomnia, depression, anxiety, and suicidal ideation.      Objective:  BP 120/68   Pulse 60   Ht 5\' 2"  (1.575 m)   Wt 142 lb 12.8 oz (64.8 kg)   SpO2 98%   BMI 26.12 kg/m   BP Readings from Last 3 Encounters:  03/26/23 120/68  09/24/22 112/74  08/28/22 128/82    Wt Readings from Last 3 Encounters:  03/26/23 142 lb 12.8 oz (64.8 kg)  12/10/22 142 lb (64.4 kg)  09/24/22 142 lb (64.4 kg)    Physical Exam Vitals reviewed.  Constitutional:      General: She is not in acute distress.    Appearance: Normal appearance. She is normal weight. She is not ill-appearing, toxic-appearing or diaphoretic.  HENT:     Head: Normocephalic.  Eyes:     General: No scleral icterus.       Right eye: No discharge.        Left eye: No discharge.     Conjunctiva/sclera: Conjunctivae normal.  Cardiovascular:     Rate and Rhythm: Normal rate and regular rhythm.     Heart sounds: Normal heart sounds.  Pulmonary:  Effort: Pulmonary effort is normal. No respiratory distress.     Breath sounds: Normal breath sounds.  Musculoskeletal:        General: Normal range of motion.  Skin:    General: Skin is warm and dry.  Neurological:     General: No focal deficit present.     Mental Status: She is alert and oriented to person, place, and time. Mental status is at baseline.     Cranial Nerves: No cranial nerve deficit.  Psychiatric:        Mood and Affect: Mood normal.        Behavior: Behavior normal.        Thought Content: Thought content normal.        Judgment: Judgment normal.    Lab Results  Component Value Date   HGBA1C 5.8  05/17/2022   HGBA1C 5.4 12/22/2014    Lab Results  Component Value Date   CREATININE 0.94 01/08/2023   CREATININE 0.84 09/24/2022   CREATININE 0.84 05/17/2022    Lab Results  Component Value Date   WBC 5.5 12/18/2017   HGB 14.5 12/18/2017   HCT 41.5 12/18/2017   PLT 213 12/18/2017   GLUCOSE 103 (H) 01/08/2023   CHOL 233 (H) 01/08/2023   TRIG 67.0 01/08/2023   HDL 62.60 01/08/2023   LDLDIRECT 148.0 01/08/2023   LDLCALC 157 (H) 01/08/2023   ALT 13 01/08/2023   AST 20 01/08/2023   NA 142 01/08/2023   K 3.7 01/08/2023   CL 109 01/08/2023   CREATININE 0.94 01/08/2023   BUN 21 01/08/2023   CO2 22 01/08/2023   TSH 2.15 09/24/2022   HGBA1C 5.8 05/17/2022   MICROALBUR <0.7 07/25/2021    MM 3D SCREENING MAMMOGRAM BILATERAL BREAST  Result Date: 11/07/2022 CLINICAL DATA:  Screening. EXAM: DIGITAL SCREENING BILATERAL MAMMOGRAM WITH TOMOSYNTHESIS AND CAD TECHNIQUE: Bilateral screening digital craniocaudal and mediolateral oblique mammograms were obtained. Bilateral screening digital breast tomosynthesis was performed. The images were evaluated with computer-aided detection. COMPARISON:  Previous exam(s). ACR Breast Density Category b: There are scattered areas of fibroglandular density. FINDINGS: There are no findings suspicious for malignancy. IMPRESSION: No mammographic evidence of malignancy. A result letter of this screening mammogram will be mailed directly to the patient. RECOMMENDATION: Screening mammogram in one year. (Code:SM-B-01Y) BI-RADS CATEGORY  1: Negative. Electronically Signed   By: Frederico Hamman M.D.   On: 11/07/2022 10:43    Assessment & Plan:  .Postmenopausal estrogen deficiency -     DG Bone Density; Future  Hypertriglyceridemia Assessment & Plan: Managed with fenofibrate  Lab Results  Component Value Date   CHOL 233 (H) 01/08/2023   HDL 62.60 01/08/2023   LDLCALC 157 (H) 01/08/2023   LDLDIRECT 148.0 01/08/2023   TRIG 67.0 01/08/2023   CHOLHDL 4  01/08/2023     Orders: -     Lipid Panel w/reflex Direct LDL; Future  Essential hypertension Assessment & Plan: Well controlled on current regimen of telmisartan and hctz  but recurent hypokalemia was noted at last visit.  Stopping hctz and potassium replaced , repeat labs due     Orders: -     Comprehensive metabolic panel; Future  Anxiety state Assessment & Plan:  She is Using  hydroxyzine and trazodone  for insomia; reserve the alprazolam for daytime prn use for stress/anger .  The risks of Chronic  alprazolam  use were reviewed  with patient today including increased risk of dementia and addiction,  and to continue  lexapro use daily  Restless legs syndrome Assessment & Plan: Managed with tizanidine prn .  Has not been problematic lately     Follow-up: No follow-ups on file.   Sherlene Shams, MD

## 2023-03-26 NOTE — Patient Instructions (Addendum)
YOUR BLOOD PRESSURE IS FINE .  CONTINUE TELMISARTAN 40 MG DAILY   PLAN TO REPEAT LABS IN  JANUARY/FEBRUARY   YOUR NEXT CPE IS DUE IN [facility]

## 2023-04-17 DIAGNOSIS — M9904 Segmental and somatic dysfunction of sacral region: Secondary | ICD-10-CM | POA: Diagnosis not present

## 2023-04-17 DIAGNOSIS — M5137 Other intervertebral disc degeneration, lumbosacral region with discogenic back pain only: Secondary | ICD-10-CM | POA: Diagnosis not present

## 2023-04-17 DIAGNOSIS — M50321 Other cervical disc degeneration at C4-C5 level: Secondary | ICD-10-CM | POA: Diagnosis not present

## 2023-04-17 DIAGNOSIS — M9903 Segmental and somatic dysfunction of lumbar region: Secondary | ICD-10-CM | POA: Diagnosis not present

## 2023-04-17 DIAGNOSIS — M9901 Segmental and somatic dysfunction of cervical region: Secondary | ICD-10-CM | POA: Diagnosis not present

## 2023-04-17 DIAGNOSIS — M50323 Other cervical disc degeneration at C6-C7 level: Secondary | ICD-10-CM | POA: Diagnosis not present

## 2023-04-17 DIAGNOSIS — M7918 Myalgia, other site: Secondary | ICD-10-CM | POA: Diagnosis not present

## 2023-04-17 DIAGNOSIS — M50322 Other cervical disc degeneration at C5-C6 level: Secondary | ICD-10-CM | POA: Diagnosis not present

## 2023-04-17 DIAGNOSIS — M5451 Vertebrogenic low back pain: Secondary | ICD-10-CM | POA: Diagnosis not present

## 2023-04-17 DIAGNOSIS — M542 Cervicalgia: Secondary | ICD-10-CM | POA: Diagnosis not present

## 2023-04-23 DIAGNOSIS — M9904 Segmental and somatic dysfunction of sacral region: Secondary | ICD-10-CM | POA: Diagnosis not present

## 2023-04-23 DIAGNOSIS — M50322 Other cervical disc degeneration at C5-C6 level: Secondary | ICD-10-CM | POA: Diagnosis not present

## 2023-04-23 DIAGNOSIS — M9901 Segmental and somatic dysfunction of cervical region: Secondary | ICD-10-CM | POA: Diagnosis not present

## 2023-04-23 DIAGNOSIS — M5451 Vertebrogenic low back pain: Secondary | ICD-10-CM | POA: Diagnosis not present

## 2023-04-23 DIAGNOSIS — M50321 Other cervical disc degeneration at C4-C5 level: Secondary | ICD-10-CM | POA: Diagnosis not present

## 2023-04-23 DIAGNOSIS — M9903 Segmental and somatic dysfunction of lumbar region: Secondary | ICD-10-CM | POA: Diagnosis not present

## 2023-04-23 DIAGNOSIS — M7918 Myalgia, other site: Secondary | ICD-10-CM | POA: Diagnosis not present

## 2023-04-23 DIAGNOSIS — M5137 Other intervertebral disc degeneration, lumbosacral region with discogenic back pain only: Secondary | ICD-10-CM | POA: Diagnosis not present

## 2023-04-23 DIAGNOSIS — M542 Cervicalgia: Secondary | ICD-10-CM | POA: Diagnosis not present

## 2023-04-23 DIAGNOSIS — M50323 Other cervical disc degeneration at C6-C7 level: Secondary | ICD-10-CM | POA: Diagnosis not present

## 2023-04-25 ENCOUNTER — Other Ambulatory Visit: Payer: Self-pay | Admitting: Internal Medicine

## 2023-04-25 DIAGNOSIS — M26622 Arthralgia of left temporomandibular joint: Secondary | ICD-10-CM | POA: Diagnosis not present

## 2023-04-25 DIAGNOSIS — H9202 Otalgia, left ear: Secondary | ICD-10-CM | POA: Diagnosis not present

## 2023-05-12 DIAGNOSIS — M50321 Other cervical disc degeneration at C4-C5 level: Secondary | ICD-10-CM | POA: Diagnosis not present

## 2023-05-12 DIAGNOSIS — M9904 Segmental and somatic dysfunction of sacral region: Secondary | ICD-10-CM | POA: Diagnosis not present

## 2023-05-12 DIAGNOSIS — M9901 Segmental and somatic dysfunction of cervical region: Secondary | ICD-10-CM | POA: Diagnosis not present

## 2023-05-12 DIAGNOSIS — M5137 Other intervertebral disc degeneration, lumbosacral region with discogenic back pain only: Secondary | ICD-10-CM | POA: Diagnosis not present

## 2023-05-12 DIAGNOSIS — M5451 Vertebrogenic low back pain: Secondary | ICD-10-CM | POA: Diagnosis not present

## 2023-05-12 DIAGNOSIS — M9903 Segmental and somatic dysfunction of lumbar region: Secondary | ICD-10-CM | POA: Diagnosis not present

## 2023-05-12 DIAGNOSIS — M542 Cervicalgia: Secondary | ICD-10-CM | POA: Diagnosis not present

## 2023-05-12 DIAGNOSIS — M7918 Myalgia, other site: Secondary | ICD-10-CM | POA: Diagnosis not present

## 2023-05-12 DIAGNOSIS — M50323 Other cervical disc degeneration at C6-C7 level: Secondary | ICD-10-CM | POA: Diagnosis not present

## 2023-05-12 DIAGNOSIS — M50322 Other cervical disc degeneration at C5-C6 level: Secondary | ICD-10-CM | POA: Diagnosis not present

## 2023-05-16 DIAGNOSIS — R69 Illness, unspecified: Secondary | ICD-10-CM | POA: Diagnosis not present

## 2023-05-28 DIAGNOSIS — H259 Unspecified age-related cataract: Secondary | ICD-10-CM | POA: Diagnosis not present

## 2023-05-28 DIAGNOSIS — Z135 Encounter for screening for eye and ear disorders: Secondary | ICD-10-CM | POA: Diagnosis not present

## 2023-05-28 DIAGNOSIS — H5203 Hypermetropia, bilateral: Secondary | ICD-10-CM | POA: Diagnosis not present

## 2023-05-28 DIAGNOSIS — H524 Presbyopia: Secondary | ICD-10-CM | POA: Diagnosis not present

## 2023-05-28 DIAGNOSIS — H5213 Myopia, bilateral: Secondary | ICD-10-CM | POA: Diagnosis not present

## 2023-06-27 ENCOUNTER — Other Ambulatory Visit: Payer: Medicare HMO

## 2023-07-01 ENCOUNTER — Telehealth: Payer: Self-pay | Admitting: Internal Medicine

## 2023-07-01 NOTE — Telephone Encounter (Signed)
Patient dropped off papaerwork needing for life insurance. Paperwork must be faxed from Doctors office. Please call patient when faxed over.

## 2023-07-02 NOTE — Telephone Encounter (Signed)
Medical record release has been faxed to our medical records office.

## 2023-07-04 ENCOUNTER — Other Ambulatory Visit (INDEPENDENT_AMBULATORY_CARE_PROVIDER_SITE_OTHER): Payer: PPO

## 2023-07-04 DIAGNOSIS — I10 Essential (primary) hypertension: Secondary | ICD-10-CM | POA: Diagnosis not present

## 2023-07-04 DIAGNOSIS — E781 Pure hyperglyceridemia: Secondary | ICD-10-CM | POA: Diagnosis not present

## 2023-07-04 LAB — COMPREHENSIVE METABOLIC PANEL
ALT: 13 U/L (ref 0–35)
AST: 21 U/L (ref 0–37)
Albumin: 4.4 g/dL (ref 3.5–5.2)
Alkaline Phosphatase: 64 U/L (ref 39–117)
BUN: 24 mg/dL — ABNORMAL HIGH (ref 6–23)
CO2: 26 meq/L (ref 19–32)
Calcium: 9.4 mg/dL (ref 8.4–10.5)
Chloride: 109 meq/L (ref 96–112)
Creatinine, Ser: 0.78 mg/dL (ref 0.40–1.20)
GFR: 79.33 mL/min (ref >60.00)
Glucose, Bld: 102 mg/dL — ABNORMAL HIGH (ref 70–99)
Potassium: 4.1 meq/L (ref 3.5–5.1)
Sodium: 141 meq/L (ref 135–145)
Total Bilirubin: 0.6 mg/dL (ref 0.2–1.2)
Total Protein: 6.6 g/dL (ref 6.0–8.3)

## 2023-07-05 ENCOUNTER — Encounter: Payer: Self-pay | Admitting: Internal Medicine

## 2023-07-05 LAB — LIPID PANEL W/REFLEX DIRECT LDL
Cholesterol: 225 mg/dL — ABNORMAL HIGH (ref <200)
HDL: 66 mg/dL (ref >=50)
LDL Cholesterol (Calc): 143 mg/dL — ABNORMAL HIGH
Non-HDL Cholesterol (Calc): 159 mg/dL — ABNORMAL HIGH (ref <130)
Total CHOL/HDL Ratio: 3.4 (calc) (ref <5.0)
Triglycerides: 69 mg/dL (ref <150)

## 2023-07-06 ENCOUNTER — Other Ambulatory Visit: Payer: Self-pay | Admitting: Internal Medicine

## 2023-07-15 ENCOUNTER — Other Ambulatory Visit: Payer: Self-pay | Admitting: Internal Medicine

## 2023-07-15 NOTE — Telephone Encounter (Signed)
 Copied from CRM [id number]. Topic: Clinical - Medication Refill >> Jul 15, 2023  2:31 PM Robinson H wrote: Most Recent Primary Care Visit:  Provider: [facility]  Department: [facility]  Visit Type: LAB  Date: 07/04/2023  Medication: hydrOXYzine  (VISTARIL ) 25 MG capsule  Has the patient contacted their pharmacy? Yes, states waiting on provider approval (Agent: If no, request that the patient contact the pharmacy for the refill. If patient does not wish to contact the pharmacy document the reason why and proceed with request.) (Agent: If yes, when and what did the pharmacy advise?)  Is this the correct pharmacy for this prescription? Yes If no, delete pharmacy and type the correct one.  This is the patient's preferred pharmacy:  [id number] [place], [place] - [address] [place] [address] Phone: [phone] Fax: [phone]   Has the prescription been filled recently? No  Is the patient out of the medication? Yes  Has the patient been seen for an appointment in the last year OR does the patient have an upcoming appointment? Yes  Can we respond through MyChart? Yes  Agent: Please be advised that Rx refills may take up to 3 business days. We ask that you follow-up with your pharmacy.

## 2023-07-15 NOTE — Telephone Encounter (Signed)
Spoke with pt and she stated that she has spoken with medical records and they are faxing the requested records.

## 2023-07-15 NOTE — Telephone Encounter (Signed)
Patient stopped by office and received a letter stating the medical records still have not been sent to the insurance company. She states needs to be sent soon.

## 2023-07-16 MED ORDER — HYDROXYZINE PAMOATE 25 MG PO CAPS: 25.0000 mg | ORAL_CAPSULE | Freq: Every day | ORAL | 1 refills | Status: DC

## 2023-07-29 ENCOUNTER — Other Ambulatory Visit: Payer: Self-pay | Admitting: Internal Medicine

## 2023-08-10 ENCOUNTER — Other Ambulatory Visit: Payer: Self-pay | Admitting: Internal Medicine

## 2023-08-14 DIAGNOSIS — M25561 Pain in right knee: Secondary | ICD-10-CM | POA: Diagnosis not present

## 2023-08-14 DIAGNOSIS — M2391 Unspecified internal derangement of right knee: Secondary | ICD-10-CM | POA: Diagnosis not present

## 2023-08-14 DIAGNOSIS — M1711 Unilateral primary osteoarthritis, right knee: Secondary | ICD-10-CM | POA: Diagnosis not present

## 2023-08-25 ENCOUNTER — Encounter: Payer: Self-pay | Admitting: Internal Medicine

## 2023-08-26 NOTE — Telephone Encounter (Signed)
 Does pt need to have a microalbumin retested

## 2023-08-27 ENCOUNTER — Encounter: Payer: Self-pay | Admitting: Internal Medicine

## 2023-08-28 DIAGNOSIS — L814 Other melanin hyperpigmentation: Secondary | ICD-10-CM | POA: Diagnosis not present

## 2023-08-28 DIAGNOSIS — D2271 Melanocytic nevi of right lower limb, including hip: Secondary | ICD-10-CM | POA: Diagnosis not present

## 2023-08-28 DIAGNOSIS — D2272 Melanocytic nevi of left lower limb, including hip: Secondary | ICD-10-CM | POA: Diagnosis not present

## 2023-08-28 DIAGNOSIS — B001 Herpesviral vesicular dermatitis: Secondary | ICD-10-CM | POA: Diagnosis not present

## 2023-08-28 DIAGNOSIS — D225 Melanocytic nevi of trunk: Secondary | ICD-10-CM | POA: Diagnosis not present

## 2023-08-28 DIAGNOSIS — L821 Other seborrheic keratosis: Secondary | ICD-10-CM | POA: Diagnosis not present

## 2023-09-01 ENCOUNTER — Encounter: Payer: Self-pay | Admitting: Internal Medicine

## 2023-10-08 DIAGNOSIS — M1711 Unilateral primary osteoarthritis, right knee: Secondary | ICD-10-CM | POA: Diagnosis not present

## 2023-10-14 ENCOUNTER — Ambulatory Visit (INDEPENDENT_AMBULATORY_CARE_PROVIDER_SITE_OTHER): Admitting: *Deleted

## 2023-10-14 ENCOUNTER — Ambulatory Visit

## 2023-10-14 VITALS — BP 138/85 | HR 56 | Ht 62.0 in | Wt 139.0 lb

## 2023-10-14 DIAGNOSIS — Z0000 Encounter for general adult medical examination without abnormal findings: Secondary | ICD-10-CM | POA: Diagnosis not present

## 2023-10-14 DIAGNOSIS — Z1231 Encounter for screening mammogram for malignant neoplasm of breast: Secondary | ICD-10-CM

## 2023-10-14 NOTE — Progress Notes (Signed)
 Subjective:   Michelle Mahoney is a 68 y.o. who presents for a Medicare Wellness preventive visit.  Visit Complete: Virtual I connected with  [name] on 10/14/23 by a audio enabled telemedicine application and verified that I am speaking with the correct person using two identifiers.  Patient Location: Home  Provider Location: Office/Clinic  I discussed the limitations of evaluation and management by telemedicine. The patient expressed understanding and agreed to proceed.  Vital Signs: Because this visit was a virtual/telehealth visit, some criteria may be missing or patient reported. Any vitals not documented were not able to be obtained and vitals that have been documented are patient reported.  VideoDeclined- This patient declined Librarian, academic. Therefore the visit was completed with audio only.  Persons Participating in Visit: Patient.  AWV Questionnaire: No: Patient Medicare AWV questionnaire was not completed prior to this visit.  Cardiac Risk Factors include: advanced age (>60men, >48 women);dyslipidemia;hypertension     Objective:    Today's Vitals   10/14/23 1308  BP: 138/85  Pulse: (!) 56  Weight: 139 lb (63 kg)  Height: 5\' 2"  (1.575 m)   Body mass index is 25.42 kg/m.     10/14/2023    1:18 PM 09/24/2022    2:02 PM 12/18/2017   11:13 AM  Advanced Directives  Does Patient Have a Medical Advance Directive? Yes Yes No  Type of Estate agent of [place];Living will Healthcare Power of [place];Living will   Copy of Healthcare Power of Attorney in Chart? No - copy requested    Would patient like information on creating a medical advance directive?   No - Patient declined    Current Medications (verified) Outpatient Encounter Medications as of 10/14/2023  Medication Sig   ALPRAZolam  (XANAX ) 0.5 MG tablet TAKE ONE TABLET AT BEDTIME AS NEEDED FOR ANXIETY   escitalopram  (LEXAPRO ) 10 MG tablet TAKE 1 TABLET BY  MOUTH EVERY DAY   fenofibrate  (TRICOR ) 48 MG tablet TAKE 1 TABLET BY MOUTH EVERY DAY   hydrOXYzine  (VISTARIL ) 25 MG capsule TAKE 1 CAPSULE BY MOUTH AT BEDTIME.   telmisartan  (MICARDIS ) 40 MG tablet TAKE 1 TABLET BY MOUTH EVERY DAY   traZODone  (DESYREL ) 50 MG tablet TAKE 1 TABLET BY MOUTH AT BEDTIME AS NEEDED FOR INSOMNIA   valACYclovir (VALTREX) 1000 MG tablet SMARTSIG:2 Tablet(s) By Mouth Every 12 Hours   No facility-administered encounter medications on file as of 10/14/2023.    Allergies (verified) Simvastatin    History: Past Medical History:  Diagnosis Date   Allergy    pollen    Chicken pox    Sinus bradycardia    Past Surgical History:  Procedure Laterality Date   ABDOMINAL HYSTERECTOMY  2001   LESION EXCISION  2008   anal area   Family History  Problem Relation Age of Onset   Cancer Mother 66       melanoma   Stroke Mother 51       secondary to septic emboli spine infection   Heart disease Father 32       Heart failure   Mental illness Father 90       dementia   Breast cancer Neg Hx    Colon cancer Neg Hx    Rectal cancer Neg Hx    Stomach cancer Neg Hx    Social History   Socioeconomic History   Marital status: Legally Separated    Spouse name: Oasis Fredrich   Number of children: Not on file  Years of education: Not on file   Highest education level: Not on file  Occupational History    Employer: [employer]  Tobacco Use   Smoking status: Never   Smokeless tobacco: Never  Vaping Use   Vaping status: Never Used  Substance and Sexual Activity   Alcohol use: Not Currently    Comment: occ   Drug use: No   Sexual activity: Not Currently  Other Topics Concern   Not on file  Social History Narrative   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   Social Drivers of Health   Financial Resource Strain: Low Risk  (10/14/2023)   Overall Financial Resource Strain (CARDIA)    Difficulty of Paying Living Expenses: Not hard at all   Food Insecurity: No Food Insecurity (10/14/2023)   Hunger Vital Sign    Worried About Running Out of Food in the Last Year: Never true    Ran Out of Food in the Last Year: Never true  Transportation Needs: No Transportation Needs (10/14/2023)   PRAPARE - Administrator, Civil Service (Medical): No    Lack of Transportation (Non-Medical): No  Physical Activity: Sufficiently Active (10/14/2023)   Exercise Vital Sign    Days of Exercise per Week: 4 days    Minutes of Exercise per Session: 50 min  Stress: No Stress Concern Present (10/14/2023)   [employer] of Occupational Health - Occupational Stress Questionnaire    Feeling of Stress : Not at all  Social Connections: Socially Isolated (10/14/2023)   Social Connection and Isolation Panel [NHANES]    Frequency of Communication with Friends and Family: More than three times a week    Frequency of Social Gatherings with Friends and Family: More than three times a week    Attends Religious Services: Never    Database administrator or Organizations: No    Attends Engineer, structural: Never    Marital Status: Divorced    Tobacco Counseling Counseling given: Not Answered    Clinical Intake:  Pre-visit preparation completed: Yes  Pain : No/denies pain     BMI - recorded: 25.42 Nutritional Status: BMI 25 -29 Overweight Nutritional Risks: None Diabetes: No  Lab Results  Component Value Date   HGBA1C 5.8 05/17/2022   HGBA1C 5.4 12/22/2014     How often do you need to have someone help you when you read instructions, pamphlets, or other written materials from your doctor or pharmacy?: 1 - Never  Interpreter Needed?: No  Information entered by :: R. Edan Serratore LPN   Activities of Daily Living     10/14/2023    1:09 PM  In your present state of health, do you have any difficulty performing the following activities:  Hearing? 0  Vision? 0  Difficulty concentrating or making decisions? 0  Walking or climbing  stairs? 0  Dressing or bathing? 0  Doing errands, shopping? 0  Preparing Food and eating ? N  Using the Toilet? N  In the past six months, have you accidently leaked urine? N  Do you have problems with loss of bowel control? N  Managing your Medications? N  Managing your Finances? N  Housekeeping or managing your Housekeeping? N    Patient Care Team: Thersia Flax, MD as PCP - General (Internal Medicine) Thersia Flax, MD (Internal Medicine) Marquita Situ Magali Schmitz, MD (General Surgery)  Indicate any recent Medical Services you may have received from other than [facility] providers in the  past year (date may be approximate).     Assessment:   This is a routine wellness examination for Aadhira.  Hearing/Vision screen Hearing Screening - Comments:: No issues Vision Screening - Comments:: glasses   Goals Addressed             This Visit's Progress    Patient Stated       Wants to continue to work out       Depression Screen     10/14/2023    1:13 PM 03/26/2023    1:56 PM 12/10/2022    4:42 PM 09/24/2022    2:04 PM 06/17/2022    3:06 PM 05/22/2022    3:39 PM 10/10/2021    9:01 AM  PHQ 2/9 Scores  PHQ - 2 Score 0 0 0 0 0 0 0  PHQ- 9 Score 0    0    Exception Documentation Patient refusal          Fall Risk     10/14/2023    1:11 PM 03/26/2023    1:56 PM 12/10/2022    4:42 PM 09/24/2022    2:03 PM 06/17/2022    3:06 PM  Fall Risk   Falls in the past year? 0 0 0 0 0  Number falls in past yr: 0 0 0 0 0  Injury with Fall? 0 0 0 0 0  Risk for fall due to : No Fall Risks No Fall Risks No Fall Risks Impaired balance/gait No Fall Risks  Follow up Falls prevention discussed;Falls evaluation completed Falls evaluation completed Falls evaluation completed Falls evaluation completed Falls evaluation completed    MEDICARE RISK AT HOME:  Medicare Risk at Home Any stairs in or around the home?: No If so, are there any without handrails?: No Home free of loose throw rugs in walkways, pet  beds, electrical cords, etc?: Yes Adequate lighting in your home to reduce risk of falls?: Yes Life alert?: No Use of a cane, walker or w/c?: No Grab bars in the bathroom?: Yes Shower chair or bench in shower?: No Elevated toilet seat or a handicapped toilet?: No  TIMED UP AND GO:  Was the test performed?  No  Cognitive Function: 6CIT completed    09/24/2022    2:07 PM  MMSE - Mini Mental State Exam  Orientation to time 5  Orientation to Place 5  Registration 3  Attention/ Calculation 5  Recall 2  Language- name 2 objects 2  Language- repeat 1  Language- follow 3 step command 3  Language- read & follow direction 1  Write a sentence 1  Copy design 1  Total score 29        10/14/2023    1:18 PM  6CIT Screen  What Year? 0 points  What month? 0 points  What time? 0 points  Count back from 20 0 points  Months in reverse 0 points  Repeat phrase 0 points  Total Score 0 points    Immunizations Immunization History  Administered Date(s) Administered   Tdap 10/25/2013    Screening Tests Health Maintenance  Topic Date Due   Zoster Vaccines- Shingrix (1 of 2) Never done   DEXA SCAN  Never done   COVID-19 Vaccine (1 - 2024-25 season) Never done   Medicare Annual Wellness (AWV)  09/24/2023   Colonoscopy  12/30/2023   Pneumonia Vaccine 31+ Years old (1 of 1 - PCV) 03/25/2024 (Originally 05/31/2007)   DTaP/Tdap/Td (2 - Td or Tdap) 10/26/2023   INFLUENZA  VACCINE  01/09/2024   MAMMOGRAM  11/05/2024   Hepatitis C Screening  Completed   HPV VACCINES  Aged Out   Meningococcal B Vaccine  Aged Out    Health Maintenance  Health Maintenance Due  Topic Date Due   Zoster Vaccines- Shingrix (1 of 2) Never done   DEXA SCAN  Never done   COVID-19 Vaccine (1 - 2024-25 season) Never done   Medicare Annual Wellness (AWV)  09/24/2023   Colonoscopy  12/30/2023   Health Maintenance Items Addressed: Mammogram ordered Order was previously placed for Dexa. Patient declines  vaccines. Patient stated that she is due a colonoscopy this year but wants to wait and discuss with PCP at next visit.   Additional Screening:  Vision Screening: Recommended annual ophthalmology exams for early detection of glaucoma and other disorders of the eye. Up to date [facility]  Dental Screening: Recommended annual dental exams for proper oral hygiene  Community Resource Referral / Chronic Care Management: CRR required this visit?  No   CCM required this visit?  No     Plan:     I have personally reviewed and noted the following in the patient's chart:   Medical and social history Use of alcohol, tobacco or illicit drugs  Current medications and supplements including opioid prescriptions. Patient is not currently taking opioid prescriptions. Functional ability and status Nutritional status Physical activity Advanced directives List of other physicians Hospitalizations, surgeries, and ER visits in previous 12 months Vitals Screenings to include cognitive, depression, and falls Referrals and appointments  In addition, I have reviewed and discussed with patient certain preventive protocols, quality metrics, and best practice recommendations. A written personalized care plan for preventive services as well as general preventive health recommendations were provided to patient.     Felicitas Horse, LPN   3/0/8657   After Visit Summary: (MyChart) Due to this being a telephonic visit, the after visit summary with patients personalized plan was offered to patient via MyChart   Notes: Please refer to Routing Comments.

## 2023-10-14 NOTE — Patient Instructions (Addendum)
 [name] , Thank you for taking time to come for your Medicare Wellness Visit. I appreciate your ongoing commitment to your health goals. Please review the following plan we discussed and let me know if I can assist you in the future.   Referrals/Orders/Follow-Ups/Clinician Recommendations: Mammogram ordered today and Dexa was ordered 03/26/2023. Consider updating your vaccines. You have an order for:  []   2D Mammogram  [x]   3D Mammogram  [x]   Bone Density     Please call for appointment:  [facility]  [address] [place] [address] [phone]    Make sure to wear two-piece clothing.  No lotions, powders, or deodorants the day of the appointment. Make sure to bring picture ID and insurance card.  Bring list of medications you are currently taking including any supplements.    This is a list of the screening recommended for you and due dates:  Health Maintenance  Topic Date Due   Zoster (Shingles) Vaccine (1 of 2) Never done   DEXA scan (bone density measurement)  Never done   COVID-19 Vaccine (1 - 2024-25 season) Never done   Colon Cancer Screening  12/30/2023   Pneumonia Vaccine (1 of 1 - PCV) 03/25/2024*   DTaP/Tdap/Td vaccine (2 - Td or Tdap) 10/26/2023   Flu Shot  01/09/2024   Medicare Annual Wellness Visit  10/13/2024   Mammogram  11/05/2024   Hepatitis C Screening  Completed   HPV Vaccine  Aged Out   Meningitis B Vaccine  Aged Out  *Topic was postponed. The date shown is not the original due date.    Advanced directives: (Copy Requested) Please bring a copy of your health care power of attorney and living will to the office to be added to your chart at your convenience. You can mail to [facility] [address] [place], [place] [address] or email to [email or web address]  Next Medicare Annual Wellness Visit scheduled for next year: Yes 10/18/24 @ 3:00  Have you seen your provider in  the last 6 months (3 months if uncontrolled diabetes)? No Last office visit was 03/26/23 upcoming appointment 03/26/24

## 2023-10-29 DIAGNOSIS — M1711 Unilateral primary osteoarthritis, right knee: Secondary | ICD-10-CM | POA: Diagnosis not present

## 2023-11-04 DIAGNOSIS — M25561 Pain in right knee: Secondary | ICD-10-CM | POA: Diagnosis not present

## 2023-11-05 ENCOUNTER — Other Ambulatory Visit: Payer: Self-pay | Admitting: Internal Medicine

## 2023-11-14 ENCOUNTER — Other Ambulatory Visit: Payer: Self-pay | Admitting: Internal Medicine

## 2023-11-14 DIAGNOSIS — M25561 Pain in right knee: Secondary | ICD-10-CM | POA: Diagnosis not present

## 2023-11-18 DIAGNOSIS — M25561 Pain in right knee: Secondary | ICD-10-CM | POA: Diagnosis not present

## 2023-11-20 DIAGNOSIS — M25561 Pain in right knee: Secondary | ICD-10-CM | POA: Diagnosis not present

## 2023-11-24 DIAGNOSIS — M25561 Pain in right knee: Secondary | ICD-10-CM | POA: Diagnosis not present

## 2023-11-26 DIAGNOSIS — M25561 Pain in right knee: Secondary | ICD-10-CM | POA: Diagnosis not present

## 2023-12-01 DIAGNOSIS — M25561 Pain in right knee: Secondary | ICD-10-CM | POA: Diagnosis not present

## 2023-12-03 ENCOUNTER — Ambulatory Visit
Admission: RE | Admit: 2023-12-03 | Discharge: 2023-12-03 | Disposition: A | Discharge status: Home / Self Care | Source: Ambulatory Visit | Attending: Internal Medicine | Admitting: Internal Medicine

## 2023-12-03 DIAGNOSIS — Z1231 Encounter for screening mammogram for malignant neoplasm of breast: Secondary | ICD-10-CM | POA: Insufficient documentation

## 2023-12-03 DIAGNOSIS — M85851 Other specified disorders of bone density and structure, right thigh: Secondary | ICD-10-CM | POA: Diagnosis not present

## 2023-12-03 DIAGNOSIS — Z780 Asymptomatic menopausal state: Secondary | ICD-10-CM

## 2023-12-03 DIAGNOSIS — M25561 Pain in right knee: Secondary | ICD-10-CM | POA: Diagnosis not present

## 2023-12-05 ENCOUNTER — Ambulatory Visit: Payer: Self-pay | Admitting: Internal Medicine

## 2023-12-09 DIAGNOSIS — M25561 Pain in right knee: Secondary | ICD-10-CM | POA: Diagnosis not present

## 2023-12-17 DIAGNOSIS — M25561 Pain in right knee: Secondary | ICD-10-CM | POA: Diagnosis not present

## 2023-12-19 ENCOUNTER — Other Ambulatory Visit: Payer: Self-pay | Admitting: Internal Medicine

## 2024-01-02 ENCOUNTER — Other Ambulatory Visit: Payer: Self-pay | Admitting: Internal Medicine

## 2024-02-06 ENCOUNTER — Other Ambulatory Visit: Payer: Self-pay | Admitting: Internal Medicine

## 2024-03-26 ENCOUNTER — Encounter: Payer: Self-pay | Admitting: Internal Medicine

## 2024-03-26 ENCOUNTER — Ambulatory Visit: Admitting: Internal Medicine

## 2024-03-26 VITALS — BP 152/74 | HR 57 | Ht 62.0 in | Wt 140.4 lb

## 2024-03-26 DIAGNOSIS — F411 Generalized anxiety disorder: Secondary | ICD-10-CM | POA: Diagnosis not present

## 2024-03-26 DIAGNOSIS — Z0000 Encounter for general adult medical examination without abnormal findings: Secondary | ICD-10-CM

## 2024-03-26 DIAGNOSIS — R7301 Impaired fasting glucose: Secondary | ICD-10-CM

## 2024-03-26 DIAGNOSIS — I10 Essential (primary) hypertension: Secondary | ICD-10-CM

## 2024-03-26 DIAGNOSIS — F419 Anxiety disorder, unspecified: Secondary | ICD-10-CM | POA: Diagnosis not present

## 2024-03-26 DIAGNOSIS — Z23 Encounter for immunization: Secondary | ICD-10-CM | POA: Diagnosis not present

## 2024-03-26 DIAGNOSIS — E7800 Pure hypercholesterolemia, unspecified: Secondary | ICD-10-CM | POA: Diagnosis not present

## 2024-03-26 DIAGNOSIS — R5383 Other fatigue: Secondary | ICD-10-CM | POA: Diagnosis not present

## 2024-03-26 DIAGNOSIS — F5105 Insomnia due to other mental disorder: Secondary | ICD-10-CM | POA: Diagnosis not present

## 2024-03-26 DIAGNOSIS — E559 Vitamin D deficiency, unspecified: Secondary | ICD-10-CM

## 2024-03-26 LAB — MICROALBUMIN / CREATININE URINE RATIO
Creatinine,U: 38 mg/dL
Microalb Creat Ratio: UNDETERMINED mg/g (ref 0.0–30.0)
Microalb, Ur: <0.7 mg/dL

## 2024-03-26 MED ORDER — ESCITALOPRAM OXALATE 10 MG PO TABS: 10.0000 mg | ORAL_TABLET | Freq: Every day | ORAL | 1 refills | Status: AC

## 2024-03-26 MED ORDER — TETANUS-DIPHTH-ACELL PERTUSSIS 5-2-15.5 LF-MCG/0.5 IM SUSP: 0.5000 mL | Freq: Once | INTRAMUSCULAR | 0 refills | Status: AC

## 2024-03-26 MED ORDER — TRAZODONE HCL 50 MG PO TABS: ORAL_TABLET | ORAL | 0 refills | Status: DC

## 2024-03-26 NOTE — Assessment & Plan Note (Signed)
She is Using  hydroxyzine and trazodone  for insomia; reserve the alprazolam for daytime prn use for stress/anger .  The risks of Chronic  alprazolam  use were reviewed  with patient today including increased risk of dementia and addiction,  and to continue  lexapro use daily

## 2024-03-26 NOTE — Progress Notes (Unsigned)
 Patient ID: Michelle Mahoney, female    DOB: 1955/09/02  Age: 68 y.o. MRN: [id number]  The patient is here for annual preventive examination and management of other chronic and acute problems.   The risk factors are reflected in the social history.   The roster of all physicians providing medical care to patient - is listed in the Snapshot section of the chart.   Activities of daily living:  The patient is 100% independent in all ADLs: dressing, toileting, feeding as well as independent mobility   Home safety : The patient has smoke detectors in the home. They wear seatbelts.  There are no unsecured firearms at home. There is no violence in the home.    There is no risks for hepatitis, STDs or HIV. There is no   history of blood transfusion. They have no travel history to infectious disease endemic areas of the world.   The patient has seen their dentist in the last six month. They have seen their eye doctor in the last year. The patinet  denies slight hearing difficulty with regard to whispered voices and some television programs.  They have deferred audiologic testing in the last year.  They do not  have excessive sun exposure. Discussed the need for sun protection: hats, long sleeves and use of sunscreen if there is significant sun exposure.    Diet: the importance of a healthy diet is discussed. They do have a healthy diet.   The benefits of regular aerobic exercise were discussed. The patient  exercises  3 to 5 days per week  for  60 minutes.    Depression screen: there are no signs or vegative symptoms of depression- irritability, change in appetite, anhedonia, sadness/tearfullness.   The following portions of the patient's history were reviewed and updated as appropriate: allergies, current medications, past family history, past medical history,  past surgical history, past social history  and problem list.   Visual acuity was not assessed per patient preference since the patient has  regular follow up with an  ophthalmologist. Hearing and body mass index were assessed and reviewed.    During the course of the visit the patient was educated and counseled about appropriate screening and preventive services including : fall prevention , diabetes screening, nutrition counseling, colorectal cancer screening, and recommended immunizations.    Chief Complaint:  `1) Hypertension: patient checks blood pressure twice weekly at home.  Readings have been for the most part <130/80 at rest . Patient is following a reduced salt diet most days and is taking telmisartan   40 mg as prescribed . Has been taking meloxicam for knee pain on average   7    days per week.  Last dose was taken today  2) Right knee pain  [facility] ,  was  injured during boot camp  in April  , improved  with PT .  Aggravated by prolonged sitting . Had an I/a injection in April/May that did not help.    3) GAD:  using alprazolam  sparingly for episodes of anxiety brought on by situations    Review of Symptoms  Patient denies headache, fevers, malaise, unintentional weight loss, skin rash, eye pain, sinus congestion and sinus pain, sore throat, dysphagia,  hemoptysis , cough, dyspnea, wheezing, chest pain, palpitations, orthopnea, edema, abdominal pain, nausea, melena, diarrhea, constipation, flank pain, dysuria, hematuria, urinary  Frequency, nocturia, numbness, tingling, seizures,  Focal weakness, Loss of consciousness,  Tremor, insomnia, depression, anxiety, and suicidal ideation.  Physical Exam:  BP (!) 152/74   Pulse (!) 57   Ht 5' 2 (1.575 m)   Wt 140 lb 6.4 oz (63.7 kg)   SpO2 95%   BMI 25.68 kg/m    Physical Exam Vitals reviewed.  Constitutional:      General: She is not in acute distress.    Appearance: Normal appearance. She is well-developed and normal weight. She is not ill-appearing, toxic-appearing or diaphoretic.  HENT:     Head: Normocephalic.     Right Ear: Tympanic membrane, ear  canal and external ear normal. There is no impacted cerumen.     Left Ear: Tympanic membrane, ear canal and external ear normal. There is no impacted cerumen.     Nose: Nose normal.     Mouth/Throat:     Mouth: Mucous membranes are moist.     Pharynx: Oropharynx is clear.  Eyes:     General: No scleral icterus.       Right eye: No discharge.        Left eye: No discharge.     Conjunctiva/sclera: Conjunctivae normal.     Pupils: Pupils are equal, round, and reactive to light.  Neck:     Thyroid : No thyromegaly.     Vascular: No carotid bruit or JVD.  Cardiovascular:     Rate and Rhythm: Normal rate and regular rhythm.     Heart sounds: Normal heart sounds.  Pulmonary:     Effort: Pulmonary effort is normal. No respiratory distress.     Breath sounds: Normal breath sounds.  Chest:  Breasts:    Breasts are symmetrical.     Right: Normal. No swelling, inverted nipple, mass, nipple discharge, skin change or tenderness.     Left: Normal. No swelling, inverted nipple, mass, nipple discharge, skin change or tenderness.  Abdominal:     General: Bowel sounds are normal.     Palpations: Abdomen is soft. There is no mass.     Tenderness: There is no abdominal tenderness. There is no guarding or rebound.  Musculoskeletal:        General: Normal range of motion.     Cervical back: Normal range of motion and neck supple.  Lymphadenopathy:     Cervical: No cervical adenopathy.     Upper Body:     Right upper body: No supraclavicular, axillary or pectoral adenopathy.     Left upper body: No supraclavicular, axillary or pectoral adenopathy.  Skin:    General: Skin is warm and dry.  Neurological:     General: No focal deficit present.     Mental Status: She is alert and oriented to person, place, and time. Mental status is at baseline.  Psychiatric:        Mood and Affect: Mood normal.        Behavior: Behavior normal.        Thought Content: Thought content normal.        Judgment:  Judgment normal.     Assessment and Plan: Essential hypertension Assessment & Plan: Elevated today.  She has been taking NSAIDS (mleoxiicam or aleve)  daily .  Advised to suspend and recheck BP in 4 days   Orders: -     Microalbumin / creatinine urine ratio -     Comprehensive metabolic panel with GFR  Pure hypercholesterolemia -     LDL cholesterol, direct -     Lipid panel  Impaired fasting blood sugar -     Comprehensive metabolic panel  with GFR -     Hemoglobin A1c  Other fatigue -     CBC with Differential/Platelet -     TSH  Vitamin D  deficiency -     VITAMIN D  25 Hydroxy (Vit-D Deficiency, Fractures)  Encounter for preventive health examination Assessment & Plan: age appropriate education and counseling updated, referrals for preventative services and immunizations addressed, dietary and smoking counseling addressed, most recent labs reviewed.  I have personally reviewed and have noted:   1) the patient's medical and social history 2) The pt's use of alcohol, tobacco, and illicit drugs 3) The patient's current medications and supplements 4) Functional ability including ADL's, fall risk, home safety risk, hearing and visual impairment 5) Diet and physical activities 6) Evidence for depression or mood disorder 7) The patient's height, weight, and BMI have been recorded in the chart   I have made referrals, and provided counseling and education based on review of the above    Anxiety state Assessment & Plan:  She is Using  hydroxyzine  and trazodone   for insomia; reserve the alprazolam  for daytime prn use for stress/anger .  The risks of Chronic  alprazolam   use were reviewed  with patient today including increased risk of dementia and addiction,  and to continue  lexapro  use daily    Insomnia secondary to anxiety Assessment & Plan: Managed with trazodone  and hydroxzine most nights. Occasional use of alprazolam  reviewed ; last refill for #30 was June 2025     Other orders -     Escitalopram  Oxalate; Take 1 tablet (10 mg total) by mouth daily.  Dispense: 90 tablet; Refill: 1 -     traZODone  HCl; TAKE 1 TABLET BY MOUTH AT BEDTIME AS NEEDED FOR INSOMNIA  Dispense: 90 tablet; Refill: 0 -     Tetanus-Diphth-Acell Pertussis; Inject 0.5 mLs into the muscle once for 1 dose.  Dispense: 0.5 mL; Refill: 0    No follow-ups on file.  Verneita LITTIE Kettering, MD

## 2024-03-26 NOTE — Assessment & Plan Note (Addendum)
 Managed with trazodone  and hydroxzine most nights. Occasional use of alprazolam  reviewed ; last refill for #30 was June 2025

## 2024-03-26 NOTE — Assessment & Plan Note (Signed)

## 2024-03-26 NOTE — Assessment & Plan Note (Signed)
 Elevated today.  Taking NSAIDS (mleoxiicam or aleve)  daily .  Advised to suspend and recheck BP in 4 days

## 2024-03-26 NOTE — Patient Instructions (Addendum)
 1) You are DUE for your tetanus-diptheria-pertussis vaccine   (TDaP)   Please get this done at your pharmacy ;  it will be PAID FOR MY MEDICARE ONLY AT YOUR PHARMACY . Rx has been sent.    2) BP IS HIGH.  PLEAES Suspend aleve and meloxicam for 4 days; it may be raising your blood pressure . Recheck your BP on Monday and send me the reading OR schedule an RN visit here next week   In the meantime :   You can take 3000 mg of acetominophen (tylenol) every day safely  In divided doses (750 mg  every 6 hours  Or 1000 mg every 8 hours.) for arthritis  and you can add ice or topical voltaren (diclofenac ) every 6 hours  Turmeric and glucosamine /chondroitin sulfate are natural supplements for arthritis that will NOT raise blood pressure    3) You have mild osteopenia by your recent DEXA , meaning that you have had some bone loss,  but not enough to call it osteoporosis.  Nevertheless, your  risk of fracture in the next  10 yrs is about slightly elevated, so I recommend weight bearing exercise, a goal intake of 1200 mg calcium through diet /supplements ,  A minimum of  1000 units Vit D daily  And we will repeat your DEXA scan in 2 years . If there has been significant progression, we will discuss medical therapy    I recommend getting half of your calcium through diet (600 mg0 and the other half through supplements.    I also recommend 1000 ius of D3 daily     almond/coconut milk  and cashew milks  are great nondairy alternatives to dairy sources for calcium  and are  cholesterol free .

## 2024-03-27 LAB — COMPREHENSIVE METABOLIC PANEL WITH GFR
ALT: 10 IU/L (ref 0–32)
AST: 25 IU/L (ref 0–40)
Albumin: 4.7 g/dL (ref 3.9–4.9)
Alkaline Phosphatase: 75 IU/L (ref 49–135)
BUN/Creatinine Ratio: 27 (ref 12–28)
BUN: 20 mg/dL (ref 8–27)
Bilirubin Total: 0.5 mg/dL (ref 0.0–1.2)
CO2: 23 mmol/L (ref 20–29)
Calcium: 9.5 mg/dL (ref 8.7–10.3)
Chloride: 104 mmol/L (ref 96–106)
Creatinine, Ser: 0.75 mg/dL (ref 0.57–1.00)
Globulin, Total: 1.8 g/dL (ref 1.5–4.5)
Glucose: 89 mg/dL (ref 70–99)
Potassium: 4 mmol/L (ref 3.5–5.2)
Sodium: 143 mmol/L (ref 134–144)
Total Protein: 6.5 g/dL (ref 6.0–8.5)
eGFR: 88 mL/min/1.73 (ref >59)

## 2024-03-27 LAB — LIPID PANEL
Chol/HDL Ratio: 3.4 ratio (ref 0.0–4.4)
Cholesterol, Total: 250 mg/dL — ABNORMAL HIGH (ref 100–199)
HDL: 73 mg/dL (ref >39)
LDL Chol Calc (NIH): 164 mg/dL — ABNORMAL HIGH (ref 0–99)
Triglycerides: 79 mg/dL (ref 0–149)
VLDL Cholesterol Cal: 13 mg/dL (ref 5–40)

## 2024-03-27 LAB — CBC WITH DIFFERENTIAL/PLATELET
Basophils Absolute: 0.1 x10E3/uL (ref 0.0–0.2)
Basos: 1 %
EOS (ABSOLUTE): 0.3 x10E3/uL (ref 0.0–0.4)
Eos: 4 %
Hematocrit: 41.1 % (ref 34.0–46.6)
Hemoglobin: 13.7 g/dL (ref 11.1–15.9)
Immature Grans (Abs): 0 x10E3/uL (ref 0.0–0.1)
Immature Granulocytes: 0 %
Lymphocytes Absolute: 2 x10E3/uL (ref 0.7–3.1)
Lymphs: 24 %
MCH: 28.1 pg (ref 26.6–33.0)
MCHC: 33.3 g/dL (ref 31.5–35.7)
MCV: 84 fL (ref 79–97)
Monocytes Absolute: 0.4 x10E3/uL (ref 0.1–0.9)
Monocytes: 5 %
Neutrophils Absolute: 5.5 x10E3/uL (ref 1.4–7.0)
Neutrophils: 66 %
Platelets: 264 x10E3/uL (ref 150–450)
RBC: 4.88 x10E6/uL (ref 3.77–5.28)
RDW: 13 % (ref 11.7–15.4)
WBC: 8.3 x10E3/uL (ref 3.4–10.8)

## 2024-03-27 LAB — TSH: TSH: 3.34 u[IU]/mL (ref 0.450–4.500)

## 2024-03-27 LAB — HEMOGLOBIN A1C
Est. average glucose Bld gHb Est-mCnc: 117 mg/dL
Hgb A1c MFr Bld: 5.7 % — ABNORMAL HIGH (ref 4.8–5.6)

## 2024-03-27 LAB — LDL CHOLESTEROL, DIRECT: LDL Direct: 161 mg/dL — ABNORMAL HIGH (ref 0–99)

## 2024-03-27 LAB — VITAMIN D 25 HYDROXY (VIT D DEFICIENCY, FRACTURES): Vit D, 25-Hydroxy: 42.6 ng/mL (ref 30.0–100.0)

## 2024-03-28 ENCOUNTER — Ambulatory Visit: Payer: Self-pay | Admitting: Internal Medicine

## 2024-04-04 DIAGNOSIS — M1711 Unilateral primary osteoarthritis, right knee: Secondary | ICD-10-CM | POA: Diagnosis not present

## 2024-04-05 ENCOUNTER — Other Ambulatory Visit: Payer: Self-pay | Admitting: Internal Medicine

## 2024-04-05 ENCOUNTER — Telehealth: Payer: Self-pay

## 2024-04-05 DIAGNOSIS — I10 Essential (primary) hypertension: Secondary | ICD-10-CM

## 2024-04-05 NOTE — Telephone Encounter (Signed)
 Pt dropped off bp readings for review. I have placed in yellow results folder.

## 2024-04-06 ENCOUNTER — Telehealth: Payer: Self-pay

## 2024-04-06 MED ORDER — AMLODIPINE BESYLATE 5 MG PO TABS: 5.0000 mg | ORAL_TABLET | Freq: Every day | ORAL | 1 refills | Status: AC

## 2024-04-06 NOTE — Assessment & Plan Note (Signed)
 Home readings after October visit have ranged from 147 to 220 systolic ,  with the majority over 160 or higher despite stopping NSAIDs.  Adding amlodipine 5 mg daily and referring to vascular surgery for renal artery dopplers.

## 2024-04-06 NOTE — Telephone Encounter (Signed)
 Copied from CRM #[id number]. Topic: Clinical - Lab/Test Results >> Apr 06, 2024  1:25 PM Shereese L wrote: Reason for CRM: Patient stated that she dropped off her BP results and would like a call back to go over them. She stated that she dropped them off at the front desk

## 2024-04-06 NOTE — Addendum Note (Signed)
 Addended by: MARYLYNN VERNEITA CROME on: 04/06/2024 04:05 PM   Modules accepted: Orders

## 2024-04-07 ENCOUNTER — Telehealth: Payer: Self-pay

## 2024-04-07 NOTE — Telephone Encounter (Signed)
 Copied from CRM #[id number]. Topic: Clinical - Medical Advice >> Apr 07, 2024  2:39 PM Burnard DEL wrote: Reason for CRM: Patient called in stating that she is schedule for a renal US  and would like to know why? She is requesting a phone call.

## 2024-04-07 NOTE — Addendum Note (Signed)
 Addended by: MARYLYNN VERNEITA CROME on: 04/07/2024 12:37 PM   Modules accepted: Orders

## 2024-04-07 NOTE — Telephone Encounter (Signed)
 See previous message

## 2024-04-07 NOTE — Telephone Encounter (Signed)
 Spoke with pt to let her know that she should continue the amlodipine 5 mg daily for one week and then recheck blood pressure for a couple of days and send us  those readings. I also let the pt know about the referral and the US  that Dr. Marylynn has ordered. Pt gave a verbal understanding.

## 2024-04-07 NOTE — Telephone Encounter (Signed)
 Spoke with pt and she stated that she went to UC on Monday evening and they prescribed her the Amlodipine 5 mg then. Pt is currently taking the Amlodipine, Telmisartan  and hydrochlorothiazide .

## 2024-04-09 DIAGNOSIS — N390 Urinary tract infection, site not specified: Secondary | ICD-10-CM | POA: Diagnosis not present

## 2024-04-28 ENCOUNTER — Other Ambulatory Visit (INDEPENDENT_AMBULATORY_CARE_PROVIDER_SITE_OTHER): Payer: Self-pay | Admitting: Nurse Practitioner

## 2024-04-28 DIAGNOSIS — I10 Essential (primary) hypertension: Secondary | ICD-10-CM

## 2024-04-29 DIAGNOSIS — M170 Bilateral primary osteoarthritis of knee: Secondary | ICD-10-CM | POA: Diagnosis not present

## 2024-04-29 DIAGNOSIS — I10 Essential (primary) hypertension: Secondary | ICD-10-CM | POA: Diagnosis not present

## 2024-04-29 DIAGNOSIS — M25561 Pain in right knee: Secondary | ICD-10-CM | POA: Diagnosis not present

## 2024-04-30 ENCOUNTER — Encounter (INDEPENDENT_AMBULATORY_CARE_PROVIDER_SITE_OTHER): Payer: Self-pay | Admitting: Nurse Practitioner

## 2024-04-30 ENCOUNTER — Ambulatory Visit (INDEPENDENT_AMBULATORY_CARE_PROVIDER_SITE_OTHER): Payer: Self-pay | Admitting: Nurse Practitioner

## 2024-04-30 ENCOUNTER — Ambulatory Visit (INDEPENDENT_AMBULATORY_CARE_PROVIDER_SITE_OTHER): Payer: Self-pay

## 2024-04-30 VITALS — BP 144/86 | HR 56 | Resp 18 | Ht 62.0 in | Wt 138.0 lb

## 2024-04-30 DIAGNOSIS — I10 Essential (primary) hypertension: Secondary | ICD-10-CM | POA: Diagnosis not present

## 2024-04-30 DIAGNOSIS — I701 Atherosclerosis of renal artery: Secondary | ICD-10-CM

## 2024-05-02 ENCOUNTER — Encounter (INDEPENDENT_AMBULATORY_CARE_PROVIDER_SITE_OTHER): Payer: Self-pay | Admitting: Nurse Practitioner

## 2024-05-02 ENCOUNTER — Other Ambulatory Visit: Payer: Self-pay | Admitting: Internal Medicine

## 2024-05-02 NOTE — Progress Notes (Signed)
 SUBJECTIVE:  Patient ID: Michelle Mahoney, female    DOB: 1955-12-02, 68 y.o.   MRN: [id number] Chief Complaint  Patient presents with   Establish Care    New patient consult + Renal Essential Hypertension ref. Tullo    Discussed the use of AI scribe software for clinical note transcription with the patient, who gave verbal consent to proceed.  History of Present Illness Michelle Mahoney is a 68 year old female with hypertension who presents for evaluation of potential renal artery stenosis. She was referred by Dr. Tullo for evaluation of potential renal artery stenosis due to fluctuating blood pressure.  She has been experiencing fluctuating blood pressure, which has been difficult to control. She is currently taking telmisartan  and amlodipine  for management. She recently started amlodipine  after a visit to urgent care and feels it might be helping. She is also on trazodone  for sleep.  An ultrasound was performed, revealing stenosis in both renal arteries in the range of 1-59%.  She has a history of knee pain following a boot camp injury in May, which has persisted. She was previously taking meloxicam for her knee pain, but stopped due to its potential to raise blood pressure. She has been using Voltaren gel and Tylenol as alternatives.  Her blood pressure at the time of the visit was 144/86, which she considers 'not terrible'. She is planning to purchase a new blood pressure monitor as she is dissatisfied with her current cuff monitor.     Results RADIOLOGY Renal artery ultrasound: Bilateral renal artery stenosis 1-59%.  Kidney size normal bilaterally.  Past Medical History:  Diagnosis Date   Allergy    pollen    Chicken pox    Sinus bradycardia     Past Surgical History:  Procedure Laterality Date   ABDOMINAL HYSTERECTOMY  2001   LESION EXCISION  2008   anal area    Social History   Socioeconomic History   Marital status: Legally Separated    Spouse name: Sharolyn Cowie    Number of children: Not on file   Years of education: Not on file   Highest education level: Not on file  Occupational History    Employer: [employer]  Tobacco Use   Smoking status: Never   Smokeless tobacco: Never  Vaping Use   Vaping status: Never Used  Substance and Sexual Activity   Alcohol use: Not Currently    Comment: occ   Drug use: No   Sexual activity: Not Currently  Other Topics Concern   Not on file  Social History Narrative   Works as a architect for [employer]   Social Drivers of Health   Financial Resource Strain: Low Risk  (10/14/2023)   Overall Financial Resource Strain (CARDIA)    Difficulty of Paying Living Expenses: Not hard at all  Food Insecurity: No Food Insecurity (10/14/2023)   Hunger Vital Sign    Worried About Running Out of Food in the Last Year: Never true    Ran Out of Food in the Last Year: Never true  Transportation Needs: No Transportation Needs (10/14/2023)   PRAPARE - Administrator, Civil Service (Medical): No    Lack of Transportation (Non-Medical): No  Physical Activity: Sufficiently Active (10/14/2023)   Exercise Vital Sign    Days of Exercise per Week: 4 days    Minutes of Exercise per Session: 50 min  Stress: No Stress Concern Present (10/14/2023)   [employer] of Occupational Health -  Occupational Stress Questionnaire    Feeling of Stress : Not at all  Social Connections: Socially Isolated (10/14/2023)   Social Connection and Isolation Panel    Frequency of Communication with Friends and Family: More than three times a week    Frequency of Social Gatherings with Friends and Family: More than three times a week    Attends Religious Services: Never    Database Administrator or Organizations: No    Attends Banker Meetings: Never    Marital Status: Divorced  Catering Manager Violence: Not At Risk (10/14/2023)   Humiliation, Afraid, Rape, and Kick questionnaire    Fear of  Current or Ex-Partner: No    Emotionally Abused: No    Physically Abused: No    Sexually Abused: No    Family History  Problem Relation Age of Onset   Cancer Mother 42       melanoma   Stroke Mother 21       secondary to septic emboli spine infection   Heart disease Father 8       Heart failure   Mental illness Father 52       dementia   Breast cancer Neg Hx    Colon cancer Neg Hx    Rectal cancer Neg Hx    Stomach cancer Neg Hx     Allergies  Allergen Reactions   Simvastatin  Other (See Comments)    Elevated liver enzymes     Review of Systems   Review of Systems: Negative Unless Checked Constitutional: [] Weight loss  [] Fever  [] Chills Cardiac: [] Chest pain   []  Atrial Fibrillation  [] Palpitations   [] Shortness of breath when laying flat   [] Shortness of breath with exertion. [] Shortness of breath at rest Vascular:  [] Pain in legs with walking   [] Pain in legs with standing [] Pain in legs when laying flat   [] Claudication    [] Pain in feet when laying flat    [] History of DVT   [] Phlebitis   [] Swelling in legs   [] Varicose veins   [] Non-healing ulcers Pulmonary:   [] Uses home oxygen   [] Productive cough   [] Hemoptysis   [] Wheeze  [] COPD   [] Asthma Neurologic:  [] Dizziness   [] Seizures  [] Blackouts [] History of stroke   [] History of TIA  [] Aphasia   [] Temporary Blindness   [] Weakness or numbness in arm   [] Weakness or numbness in leg Musculoskeletal:   [] Joint swelling   [] Joint pain   [] Low back pain  []  History of Knee Replacement [] Arthritis [] back Surgeries  []  Spinal Stenosis    Hematologic:  [] Easy bruising  [] Easy bleeding   [] Hypercoagulable state   [] Anemic Gastrointestinal:  [] Diarrhea   [] Vomiting  [] Gastroesophageal reflux/heartburn   [] Difficulty swallowing. [] Abdominal pain Genitourinary:  [] Chronic kidney disease   [] Difficult urination  [] Anuric   [] Blood in urine [] Frequent urination  [] Burning with urination   [] Hematuria Skin:  [] Rashes   [] Ulcers  [] Wounds Psychological:  [] History of anxiety   []  History of major depression  []  Memory Difficulties      OBJECTIVE:     BP (!) 144/86 (BP Location: Left Arm, Patient Position: Sitting, Cuff Size: Normal)   Pulse (!) 56   Resp 18   Ht 5' 2 (1.575 m)   Wt 138 lb (62.6 kg)   BMI 25.24 kg/m   Physical Exam Physical Exam VITALS: BP- 144/86 EXTREMITIES: No ankle swelling   CMP     Component Value Date/Time   NA 143 03/26/2024 1422  K 4.0 03/26/2024 1422   CL 104 03/26/2024 1422   CO2 23 03/26/2024 1422   GLUCOSE 89 03/26/2024 1422   GLUCOSE 102 (H) 07/04/2023 0939   BUN 20 03/26/2024 1422   CREATININE 0.75 03/26/2024 1422   CALCIUM 9.5 03/26/2024 1422   PROT 6.5 03/26/2024 1422   ALBUMIN 4.7 03/26/2024 1422   AST 25 03/26/2024 1422   ALT 10 03/26/2024 1422   ALKPHOS 75 03/26/2024 1422   BILITOT 0.5 03/26/2024 1422   GFR 79.33 07/04/2023 0939   EGFR 88 03/26/2024 1422   GFRNONAA >60 12/18/2017 1113    No results found.     ASSESSMENT AND PLAN:  1. Renal artery stenosis (Primary) Non-significant bilateral renal artery stenosis Bilateral renal artery stenosis less than 20-30%, not significant for intervention. Explained condition is progressive but may not require future intervention. - Continue telmisartan  and amlodipine  for blood pressure management. - Monitor blood pressure regularly. - Avoid NSAIDs like meloxicam. - Consider Voltaren gel or Tylenol for pain. - Schedule annual follow-up for renal artery stenosis.  2. Essential hypertension Continue antihypertensive medications as already ordered, these medications have been reviewed and there are no changes at this time  Current Outpatient Medications on File Prior to Visit  Medication Sig Dispense Refill   ALPRAZolam  (XANAX ) 0.5 MG tablet TAKE ONE TABLET AT BEDTIME AS NEEDED FOR ANXIETY 30 tablet 5   amLODipine  (NORVASC ) 5 MG tablet Take 1 tablet (5 mg total) by mouth daily. 90 tablet 1   escitalopram   (LEXAPRO ) 10 MG tablet Take 1 tablet (10 mg total) by mouth daily. 90 tablet 1   fenofibrate  (TRICOR ) 48 MG tablet TAKE 1 TABLET BY MOUTH EVERY DAY 90 tablet 1   hydrOXYzine  (VISTARIL ) 25 MG capsule TAKE 1 CAPSULE BY MOUTH EVERYDAY AT BEDTIME 90 capsule 0   meloxicam (MOBIC) 7.5 MG tablet Take 7.5 mg by mouth 2 (two) times daily.     telmisartan  (MICARDIS ) 40 MG tablet TAKE 1 TABLET BY MOUTH EVERY DAY 90 tablet 1   traZODone  (DESYREL ) 50 MG tablet TAKE 1 TABLET BY MOUTH AT BEDTIME AS NEEDED FOR INSOMNIA 90 tablet 0   valACYclovir (VALTREX) 1000 MG tablet SMARTSIG:2 Tablet(s) By Mouth Every 12 Hours     No current facility-administered medications on file prior to visit.    There are no Patient Instructions on file for this visit. No follow-ups on file.   Madsen Riddle E Jeffie Spivack, NP  This note was completed with Office Manager.  Any errors are purely unintentional.

## 2024-05-03 ENCOUNTER — Ambulatory Visit: Payer: Self-pay | Admitting: Internal Medicine

## 2024-05-05 IMAGING — MG MM DIGITAL SCREENING BILAT W/ TOMO AND CAD
6 of 10 series · 6 of 30 positions shown · non-contrast
Comparison: Previous exam(s).

CLINICAL DATA: Screening.

EXAM:
DIGITAL SCREENING BILATERAL MAMMOGRAM WITH TOMOSYNTHESIS AND CAD
TECHNIQUE: Bilateral screening digital craniocaudal and mediolateral oblique
mammograms were obtained. Bilateral screening digital breast
tomosynthesis was performed. The images were evaluated with
computer-aided detection.

[R MLO synth-2D]
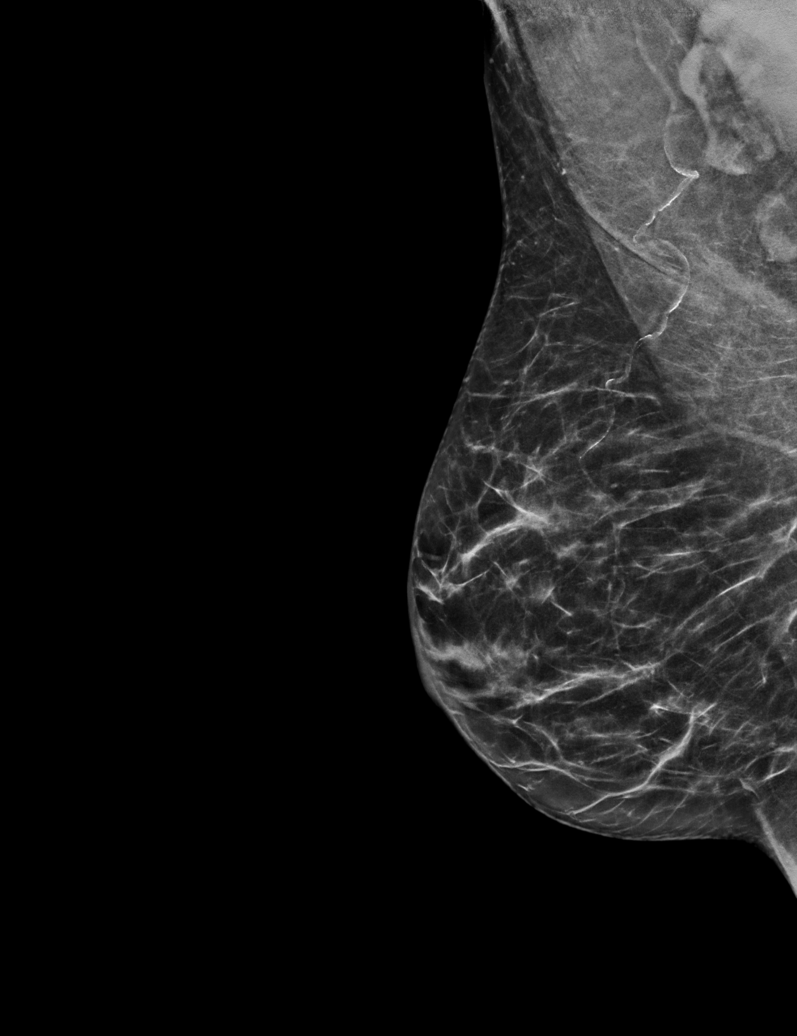

[L MLO synth-2D (1 of 2)]
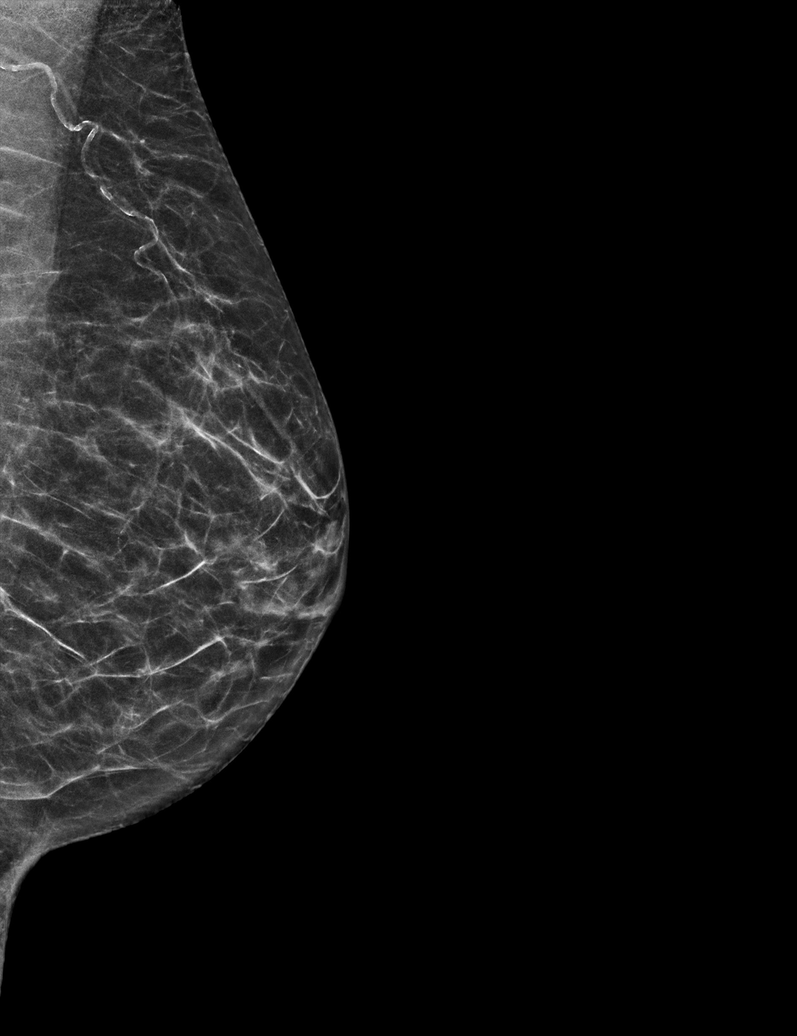

[R CC synth-2D]
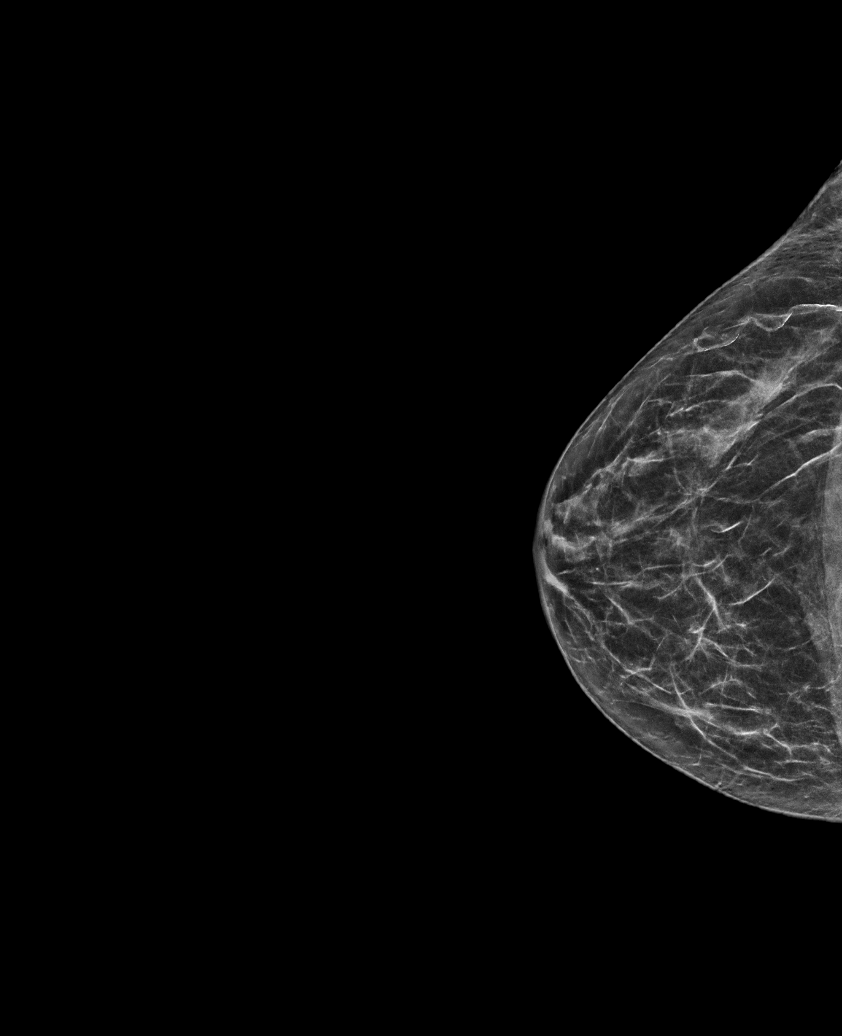

[L CC synth-2D]
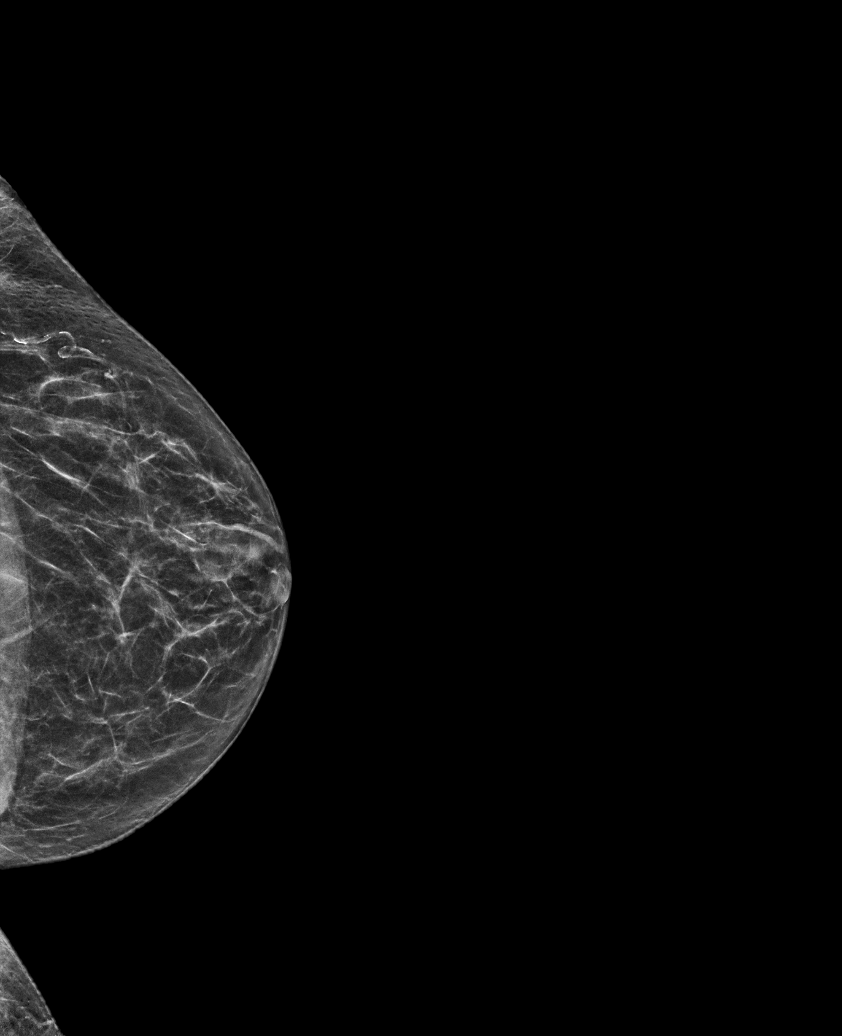

[L MLO synth-2D (2 of 2)]
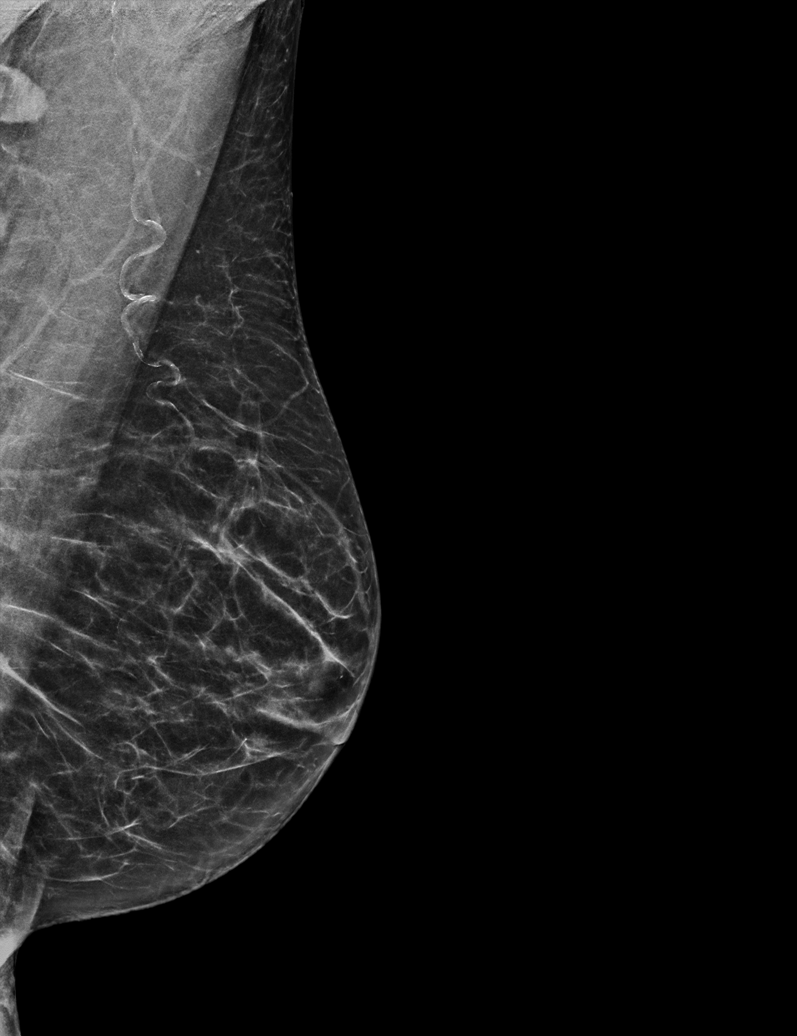

[L CC tomo · tomo slice 27/54.0]
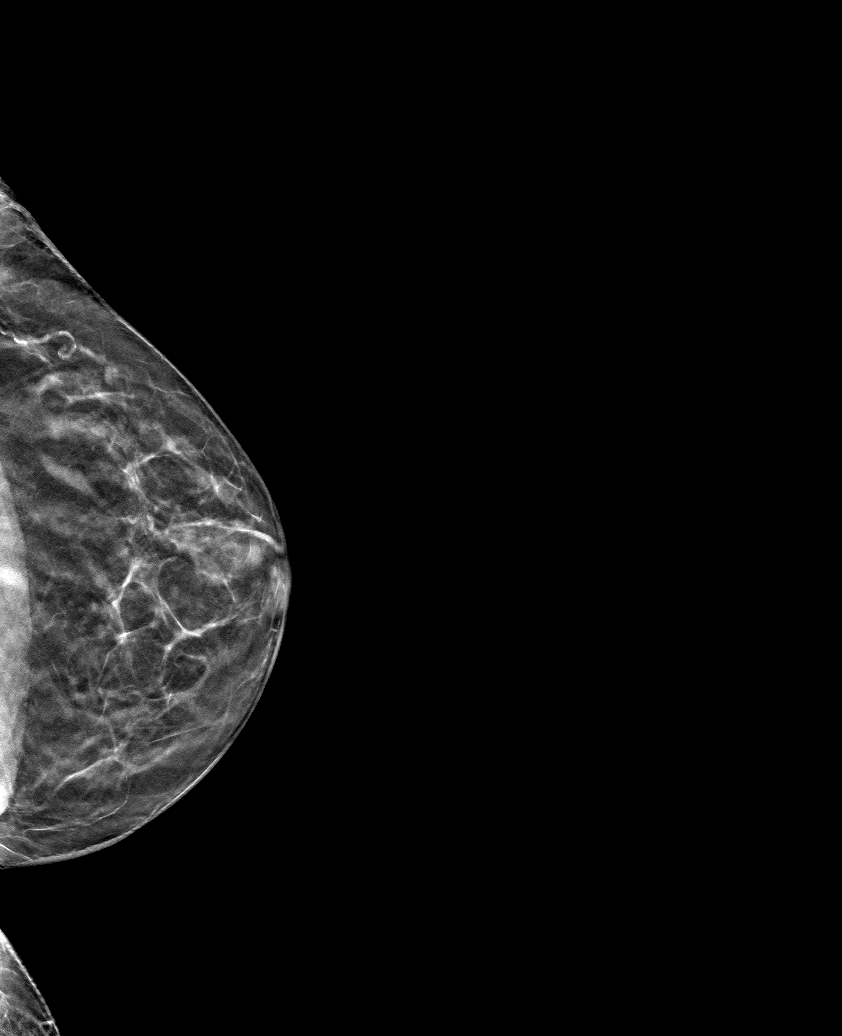

[6 of 30 positions shown; findings below may reference images not displayed]

ACR Breast Density Category b: There are scattered areas of
fibroglandular density.
FINDINGS: There are no findings suspicious for malignancy.
IMPRESSION: No mammographic evidence of malignancy. A result letter of this
screening mammogram will be mailed directly to the patient.

RECOMMENDATION:
Screening mammogram in one year. (Code:51-O-LD2)

BI-RADS CATEGORY  1: Negative.

## 2024-05-10 DIAGNOSIS — M25561 Pain in right knee: Secondary | ICD-10-CM | POA: Diagnosis not present

## 2024-05-14 IMAGING — US US ABDOMEN COMPLETE
1 series · 15 of 25 positions shown · non-contrast
Comparison: None Available.

CLINICAL DATA: Abnormal LFTs.

EXAM:
ABDOMEN ULTRASOUND COMPLETE

[Series 1: us abdomen complete · 15 of 144 slices shown]
[im 1/144]
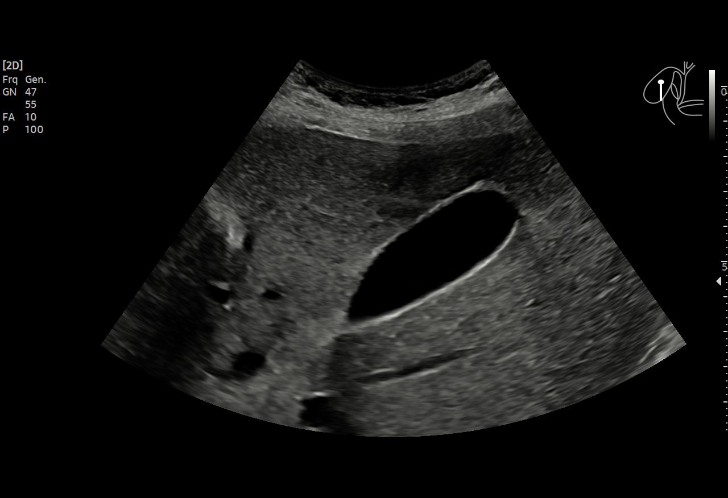
[im 12/144]
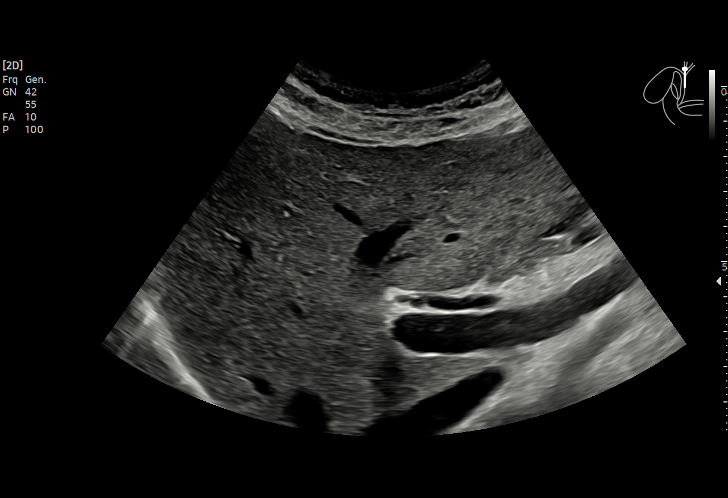
[im 24/144]
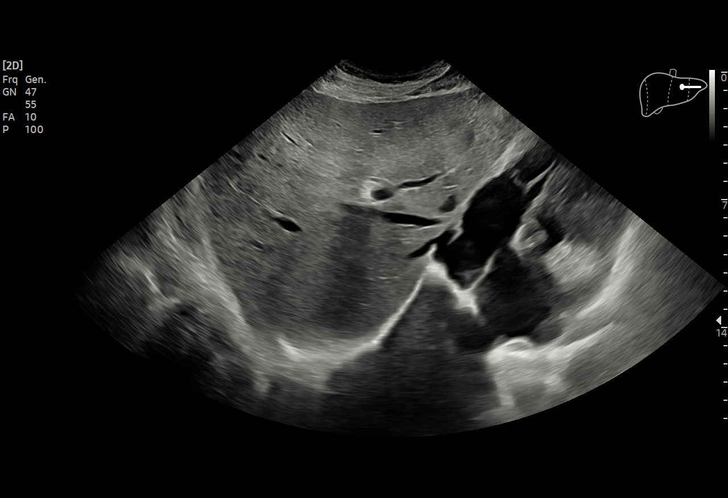
[im 30/144]
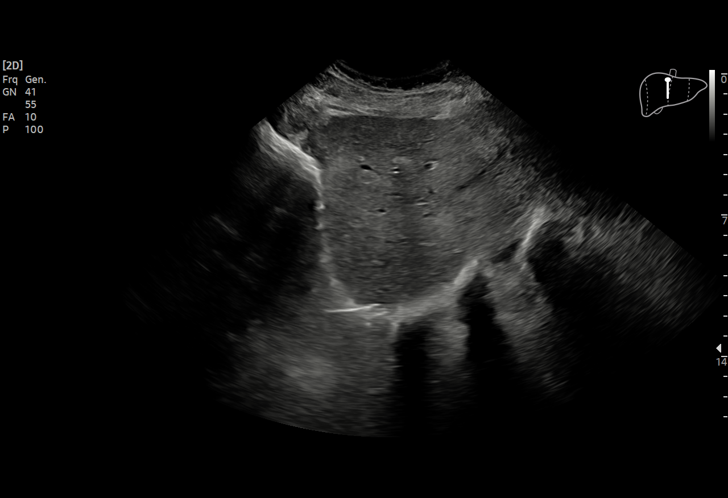
[im 42/144]
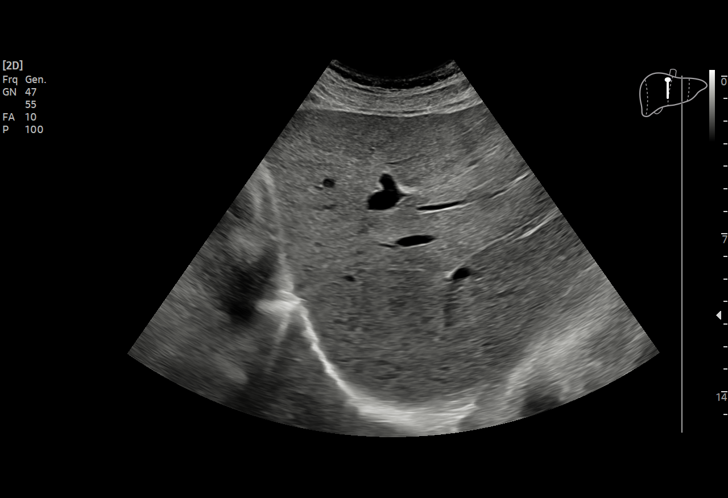
[im 54/144]
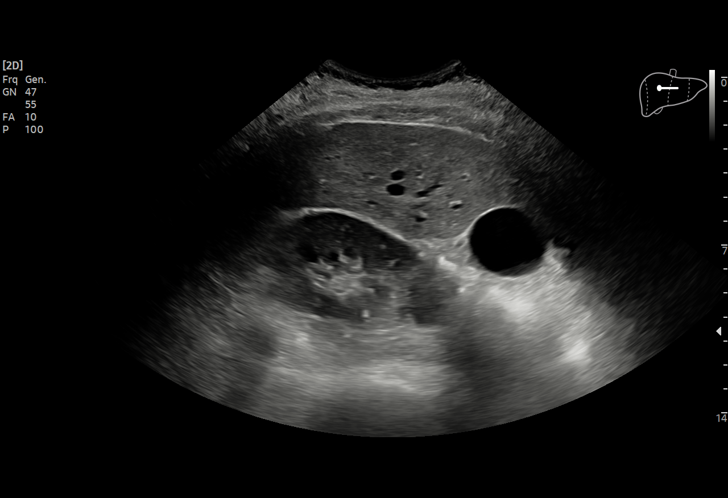
[im 60/144]
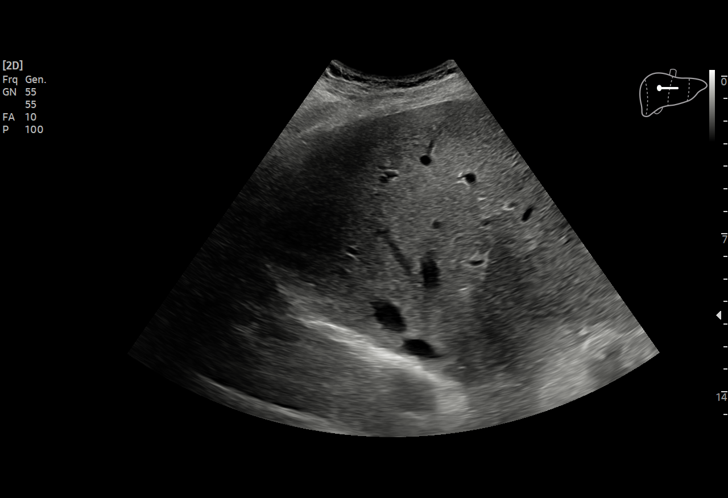
[im 72/144]
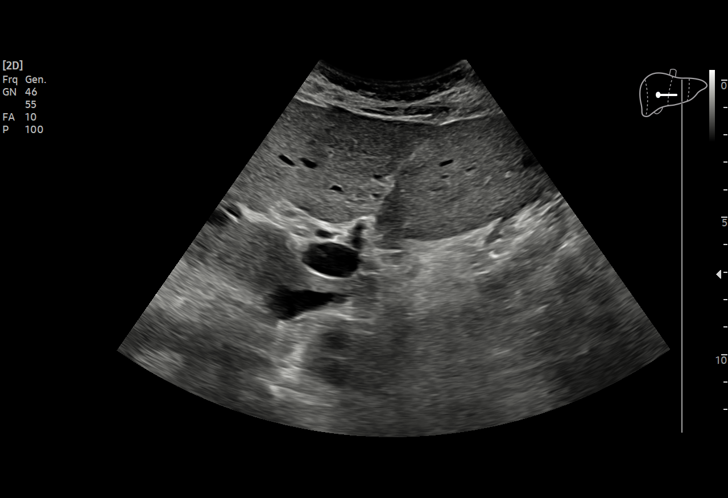
[im 84/144]
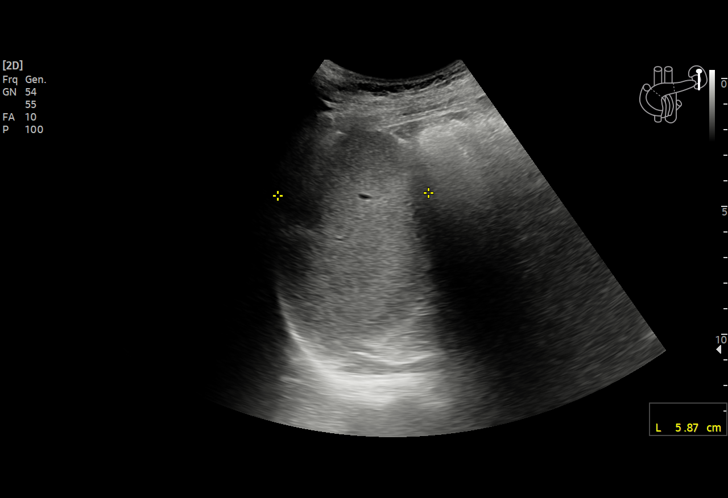
[im 90/144]
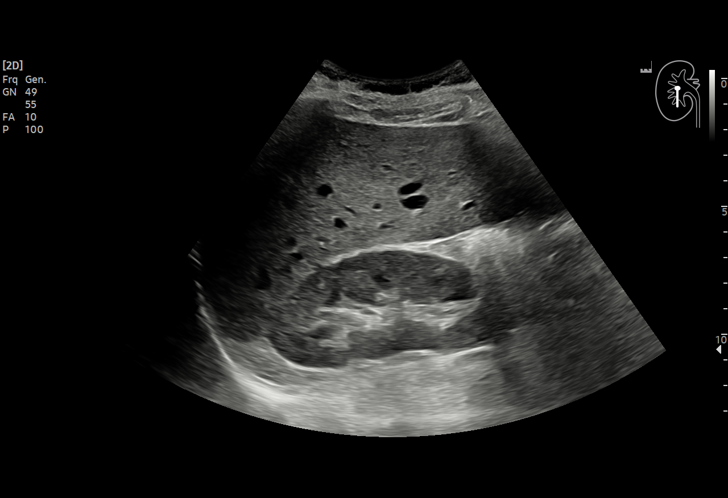
[im 102/144]
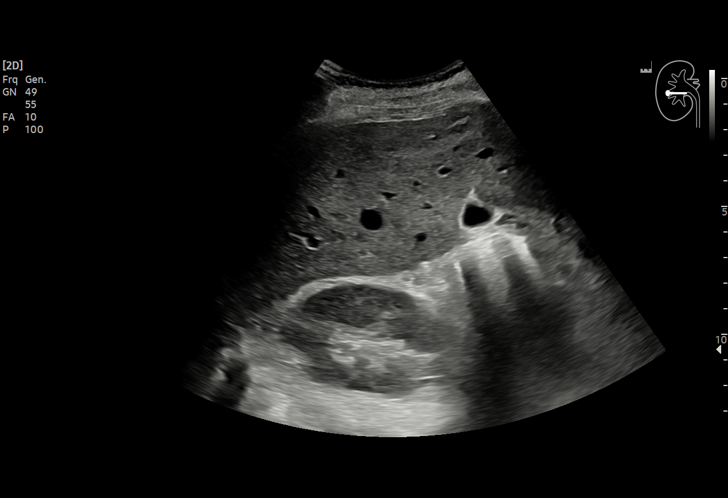
[im 114/144]
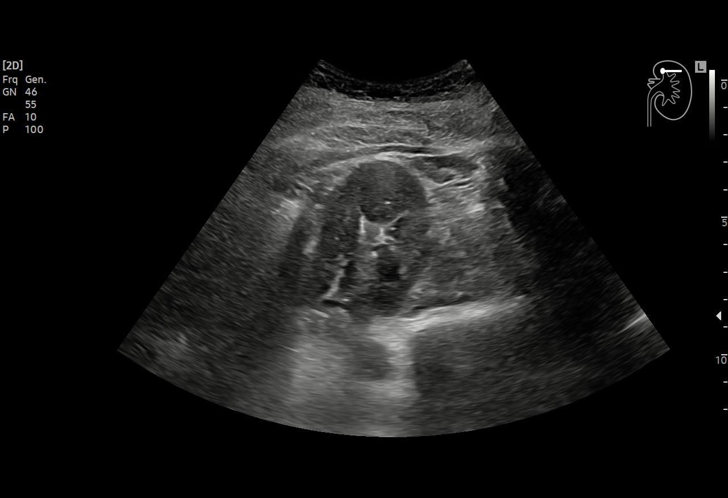
[im 120/144]
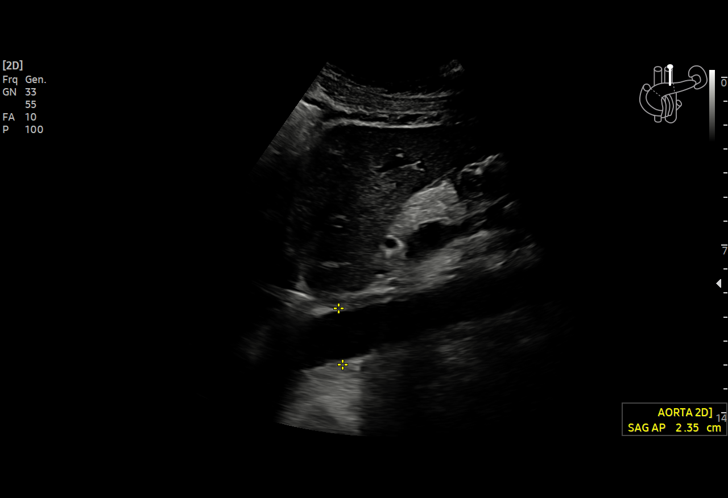
[im 132/144]
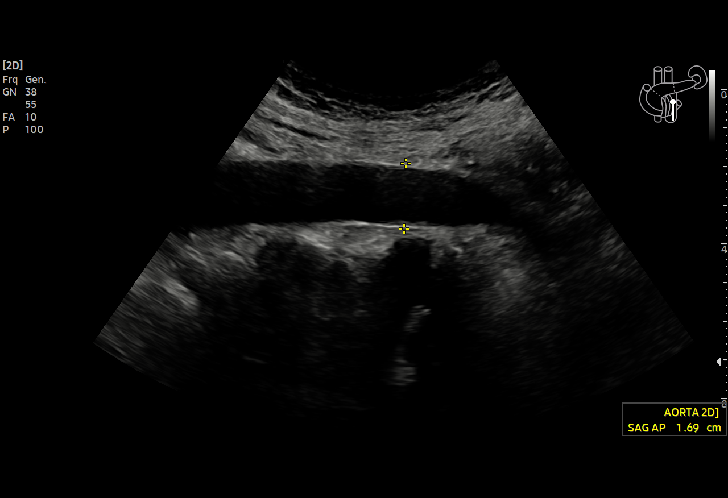
[im 144/144]
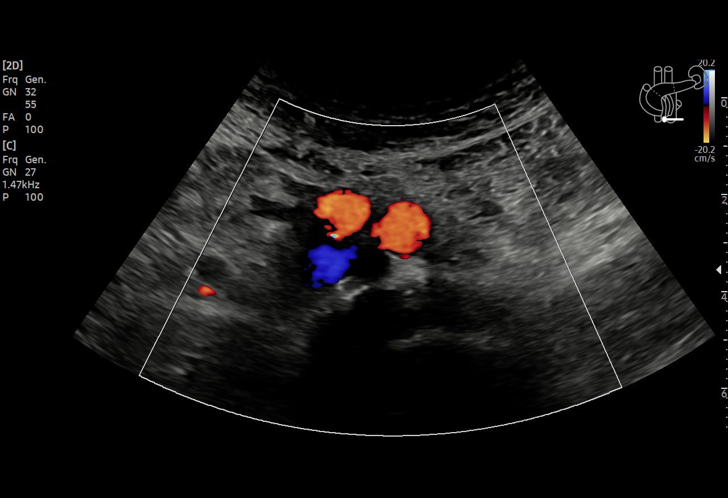

[15 of 25 positions shown; findings below may reference images not displayed]

FINDINGS: Gallbladder: No gallstones or wall thickening visualized. No
sonographic Murphy sign noted by sonographer.

Common bile duct: Diameter: 4 mm

Liver: No focal lesion identified. Within normal limits in
parenchymal echogenicity. Portal vein is patent on color Doppler
imaging with normal direction of blood flow towards the liver.

IVC: No abnormality visualized.

Pancreas: Visualized portion unremarkable.

Spleen: Size and appearance within normal limits.

Right Kidney: Length: 10.4 cm. Echogenicity within normal limits. No
mass or hydronephrosis visualized.

Left Kidney: Length: 10.3 cm. Echogenicity within normal limits. No
mass or hydronephrosis visualized.

Abdominal aorta: No aneurysm visualized.

Other findings: None.
IMPRESSION: No cholelithiasis or sonographic evidence for acute cholecystitis.

No acute process.

## 2024-06-20 ENCOUNTER — Other Ambulatory Visit: Payer: Self-pay | Admitting: Internal Medicine

## 2024-10-18 ENCOUNTER — Ambulatory Visit

## 2025-04-29 ENCOUNTER — Ambulatory Visit (INDEPENDENT_AMBULATORY_CARE_PROVIDER_SITE_OTHER): Admitting: Nurse Practitioner

## 2025-04-29 ENCOUNTER — Encounter (INDEPENDENT_AMBULATORY_CARE_PROVIDER_SITE_OTHER)
